# Patient Record
Sex: Male | Born: 1985 | Race: White | Hispanic: No | Marital: Married | Smoking: Never smoker
Health system: Southern US, Community
[De-identification: ages and names within clinical notes are randomized; demographics above are authoritative.]

---

## 2003-07-08 ENCOUNTER — Emergency Department (HOSPITAL_COMMUNITY): Admission: EM | Admit: 2003-07-08 | Discharge: 2003-07-08 | Payer: Self-pay | Admitting: Emergency Medicine

## 2010-04-13 ENCOUNTER — Ambulatory Visit
Admission: RE | Admit: 2010-04-13 | Discharge: 2010-04-13 | Payer: Self-pay | Source: Home / Self Care | Attending: Family Medicine | Admitting: Family Medicine

## 2010-04-13 DIAGNOSIS — J309 Allergic rhinitis, unspecified: Secondary | ICD-10-CM | POA: Insufficient documentation

## 2010-05-12 NOTE — Assessment & Plan Note (Signed)
Summary: NEW TO EST/OK PER CHEMIRA/KN   Vital Signs:  Patient profile:   25 year old male Height:      71 inches Weight:      143 pounds BMI:     20.02 Temp:     98.5 degrees F oral BP sitting:   100 / 70  (left arm)  Vitals Entered By: Doristine Devoid CMA (April 13, 2010 2:17 PM) CC: NEW EST- sinus congestion and cough   History of Present Illness: 25 yo man here today to establish care.  previous MD- none.  cough- sxs started 'a couple of weeks ago'.  cough is mostly dry but occasionally productive.  Tm 100, no recent fever.  no ear pain.  no facial pain/pressure.  mild sore throat.  + sick contacts.  hx of minor seasonal allergies.  Preventive Screening-Counseling & Management  Alcohol-Tobacco     Alcohol drinks/day: <1     Smoking Status: never  Caffeine-Diet-Exercise     Does Patient Exercise: no      Sexual History:  currently monogamous.        Drug Use:  never and marijuana.    Current Medications (verified): 1)  None  Allergies (verified): No Known Drug Allergies  Past History:  Past Medical History: none   Past Surgical History: none  Family History: CAD-no HTN-no DM-no STROKE-no COLON CA-no PROSTATE CA-no  Social History: married 1 dog works as Research scientist (physical sciences) at [employer] Status:  never Does Patient Exercise:  no Drug Use:  never, marijuana Sexual History:  currently monogamous  Review of Systems      See HPI  Physical Exam  General:  Well-developed,well-nourished,in no acute distress; alert,appropriate and cooperative throughout examination Head:  Normocephalic and atraumatic without obvious abnormalities. No apparent alopecia or balding.  no TTP over sinuses Eyes:  no injxn or inflammation Ears:  External ear exam shows no significant lesions or deformities.  Otoscopic examination reveals clear canals, tympanic membranes are intact bilaterally without bulging, retraction,  inflammation or discharge. Hearing is grossly normal bilaterally. Nose:  + congestion and turbinate edema Mouth:  + PND Neck:  No deformities, masses, or tenderness noted. Lungs:  Normal respiratory effort, chest expands symmetrically. Lungs are clear to auscultation, no crackles or wheezes.  + dry cough Heart:  Normal rate and regular rhythm. S1 and S2 normal without gallop, murmur, click, rub or other extra sounds.   Impression & Recommendations:  Problem # 1:  BRONCHITIS- ACUTE (ICD-466.0) Assessment New given duration of illness will start Azithromycin and cough meds as needed.  reviewed supportive care and red flags that should prompt return.  Pt expresses understanding and is in agreement w/ this plan. His updated medication list for this problem includes:    Azithromycin 250 Mg Tabs (Azithromycin) .Marland Kitchen... 2 by  mouth today and then 1 daily for 4 days    Tessalon 200 Mg Caps (Benzonatate) .Marland Kitchen... Take one capsule by mouth three times a day as needed for cough  Problem # 2:  RHINITIS (ICD-477.9) Assessment: New PND likely contributing to pt's cough.  start nasal steroid spray to improve sxs. His updated medication list for this problem includes:    Nasonex 50 Mcg/act Susp (Mometasone furoate) .Marland Kitchen... 2 sprays each nostril once daily  Complete Medication List: 1)  Azithromycin 250 Mg Tabs (Azithromycin) .... 2 by  mouth today and then 1 daily for 4 days 2)  Tessalon 200 Mg Caps (Benzonatate) .... Take one capsule  by mouth three times a day as needed for cough 3)  Nasonex 50 Mcg/act Susp (Mometasone furoate) .... 2 sprays each nostril once daily  Patient Instructions: 1)  This appears to be a bronchitis/post nasal drip combo 2)  Take the Azithromycin for the bronchitis 3)  Use the nasal spray for the post nasal drip 4)  Take the cough pills as needed 5)  Drink plenty of fluids 6)  Call with any questions or concerns 7)  Hang in there! 8)  Welcome!  We're glad to have  you! Prescriptions: NASONEX 50 MCG/ACT SUSP (MOMETASONE FUROATE) 2 sprays each nostril once daily  #1 x 3   Entered and Authorized by:   Neena Rhymes MD   Signed by:   Neena Rhymes MD on 04/13/2010   Method used:   Electronically to        [facility] (retail)       [address]       [place], [place]  [address]       Ph: [phone]       Fax: [phone]   RxID:   [id number] TESSALON 200 MG CAPS (BENZONATATE) Take one capsule by mouth three times a day as needed for cough  #60 x 0   Entered and Authorized by:   Neena Rhymes MD   Signed by:   Neena Rhymes MD on 04/13/2010   Method used:   Electronically to        [facility] (retail)       [address]       [place], [place]  [address]       Ph: [phone]       Fax: [phone]   RxID:   [id number] AZITHROMYCIN 250 MG  TABS (AZITHROMYCIN) 2 by  mouth today and then 1 daily for 4 days  #6 x 0   Entered and Authorized by:   Neena Rhymes MD   Signed by:   Neena Rhymes MD on 04/13/2010   Method used:   Electronically to        [facility] (retail)       [address]       [place], [place]  [address]       Ph: [phone]       Fax: [phone]   RxID:   [id number]    Orders Added: 1)  New Patient Level II [07371]

## 2017-05-12 ENCOUNTER — Emergency Department (HOSPITAL_BASED_OUTPATIENT_CLINIC_OR_DEPARTMENT_OTHER): Payer: 59

## 2017-05-12 ENCOUNTER — Observation Stay (HOSPITAL_BASED_OUTPATIENT_CLINIC_OR_DEPARTMENT_OTHER)
Admission: EM | Admit: 2017-05-12 | Discharge: 2017-05-13 | Disposition: A | Discharge status: Home / Self Care | Payer: 59 | Attending: Surgery | Admitting: Surgery

## 2017-05-12 ENCOUNTER — Other Ambulatory Visit: Payer: Self-pay

## 2017-05-12 ENCOUNTER — Observation Stay (HOSPITAL_COMMUNITY): Payer: 59 | Admitting: Certified Registered"

## 2017-05-12 ENCOUNTER — Encounter (HOSPITAL_BASED_OUTPATIENT_CLINIC_OR_DEPARTMENT_OTHER): Payer: Self-pay | Admitting: *Deleted

## 2017-05-12 ENCOUNTER — Encounter (HOSPITAL_COMMUNITY)
Admission: EM | Disposition: A | Discharge status: Home / Self Care | Payer: Self-pay | Source: Home / Self Care | Attending: Emergency Medicine

## 2017-05-12 DIAGNOSIS — K381 Appendicular concretions: Secondary | ICD-10-CM | POA: Insufficient documentation

## 2017-05-12 DIAGNOSIS — R911 Solitary pulmonary nodule: Secondary | ICD-10-CM | POA: Diagnosis present

## 2017-05-12 DIAGNOSIS — K3580 Unspecified acute appendicitis: Principal | ICD-10-CM | POA: Insufficient documentation

## 2017-05-12 DIAGNOSIS — N400 Enlarged prostate without lower urinary tract symptoms: Secondary | ICD-10-CM | POA: Insufficient documentation

## 2017-05-12 HISTORY — PX: LAPAROSCOPIC APPENDECTOMY: SHX408

## 2017-05-12 LAB — CBC WITH DIFFERENTIAL/PLATELET
Basophils Absolute: 0 10*3/uL (ref 0.0–0.1)
Basophils Relative: 0 %
EOS PCT: 2 %
Eosinophils Absolute: 0.2 10*3/uL (ref 0.0–0.7)
HCT: 41.8 % (ref 39.0–52.0)
HEMOGLOBIN: 14.4 g/dL (ref 13.0–17.0)
LYMPHS ABS: 1.6 10*3/uL (ref 0.7–4.0)
LYMPHS PCT: 18 %
MCH: 30 pg (ref 26.0–34.0)
MCHC: 34.4 g/dL (ref 30.0–36.0)
MCV: 87.1 fL (ref 78.0–100.0)
Monocytes Absolute: 1.1 10*3/uL — ABNORMAL HIGH (ref 0.1–1.0)
Monocytes Relative: 13 %
Neutro Abs: 5.8 10*3/uL (ref 1.7–7.7)
Neutrophils Relative %: 67 %
PLATELETS: 166 10*3/uL (ref 150–400)
RBC: 4.8 MIL/uL (ref 4.22–5.81)
RDW: 13.1 % (ref 11.5–15.5)
WBC: 8.7 10*3/uL (ref 4.0–10.5)

## 2017-05-12 LAB — URINALYSIS, ROUTINE W REFLEX MICROSCOPIC
Bilirubin Urine: NEGATIVE
GLUCOSE, UA: NEGATIVE mg/dL
HGB URINE DIPSTICK: NEGATIVE
Ketones, ur: NEGATIVE mg/dL
Leukocytes, UA: NEGATIVE
Nitrite: NEGATIVE
Protein, ur: NEGATIVE mg/dL
SPECIFIC GRAVITY, URINE: 1.025 (ref 1.005–1.030)
pH: 5.5 (ref 5.0–8.0)

## 2017-05-12 LAB — COMPREHENSIVE METABOLIC PANEL
ALT: 38 U/L (ref 17–63)
AST: 31 U/L (ref 15–41)
Albumin: 4.4 g/dL (ref 3.5–5.0)
Alkaline Phosphatase: 62 U/L (ref 38–126)
Anion gap: 6 (ref 5–15)
BUN: 11 mg/dL (ref 6–20)
CHLORIDE: 104 mmol/L (ref 101–111)
CO2: 26 mmol/L (ref 22–32)
CREATININE: 0.72 mg/dL (ref 0.61–1.24)
Calcium: 9.1 mg/dL (ref 8.9–10.3)
GFR calc non Af Amer: >60 mL/min (ref >60)
Glucose, Bld: 87 mg/dL (ref 65–99)
Potassium: 3.5 mmol/L (ref 3.5–5.1)
Sodium: 136 mmol/L (ref 135–145)
Total Bilirubin: 0.4 mg/dL (ref 0.3–1.2)
Total Protein: 7.5 g/dL (ref 6.5–8.1)

## 2017-05-12 LAB — LIPASE, BLOOD: Lipase: 23 U/L (ref 11–51)

## 2017-05-12 SURGERY — APPENDECTOMY, LAPAROSCOPIC
Anesthesia: General | Site: Abdomen

## 2017-05-12 MED ORDER — FENTANYL CITRATE (PF) 250 MCG/5ML IJ SOLN
INTRAMUSCULAR | Status: DC | PRN
  Administered 2017-05-12 (×4): 50 ug via INTRAVENOUS

## 2017-05-12 MED ORDER — HYDROMORPHONE HCL 1 MG/ML IJ SOLN: 1.0000 mg | INTRAMUSCULAR | Status: DC | PRN

## 2017-05-12 MED ORDER — MIDAZOLAM HCL 2 MG/2ML IJ SOLN
INTRAMUSCULAR | Status: AC
  Filled 2017-05-12: qty 2

## 2017-05-12 MED ORDER — FENTANYL CITRATE (PF) 250 MCG/5ML IJ SOLN
INTRAMUSCULAR | Status: AC
Start: 2017-05-12 — End: 2017-05-12
  Filled 2017-05-12: qty 5

## 2017-05-12 MED ORDER — ONDANSETRON HCL 4 MG/2ML IJ SOLN
INTRAMUSCULAR | Status: AC
  Filled 2017-05-12: qty 2

## 2017-05-12 MED ORDER — MIDAZOLAM HCL 2 MG/2ML IJ SOLN
INTRAMUSCULAR | Status: DC | PRN
  Administered 2017-05-12: 2 mg via INTRAVENOUS

## 2017-05-12 MED ORDER — LACTATED RINGERS IV SOLN
INTRAVENOUS | Status: DC | PRN
  Administered 2017-05-12: 16:00:00 via INTRAVENOUS

## 2017-05-12 MED ORDER — SIMETHICONE 80 MG PO CHEW
40.0000 mg | CHEWABLE_TABLET | Freq: Four times a day (QID) | ORAL | Status: DC | PRN
Start: 2017-05-12 — End: 2017-05-13

## 2017-05-12 MED ORDER — METRONIDAZOLE IN NACL 5-0.79 MG/ML-% IV SOLN
500.0000 mg | Freq: Three times a day (TID) | INTRAVENOUS | Status: DC
  Administered 2017-05-12: 500 mg via INTRAVENOUS
  Filled 2017-05-12: qty 100

## 2017-05-12 MED ORDER — OXYCODONE HCL 5 MG PO TABS
5.0000 mg | ORAL_TABLET | ORAL | Status: DC | PRN
  Administered 2017-05-12 – 2017-05-13 (×3): 10 mg via ORAL
  Filled 2017-05-12 (×3): qty 2

## 2017-05-12 MED ORDER — HYDROMORPHONE HCL 1 MG/ML IJ SOLN
INTRAMUSCULAR | Status: AC
  Filled 2017-05-12: qty 1

## 2017-05-12 MED ORDER — LIDOCAINE 2% (20 MG/ML) 5 ML SYRINGE
INTRAMUSCULAR | Status: DC | PRN
  Administered 2017-05-12: 100 mg via INTRAVENOUS

## 2017-05-12 MED ORDER — METRONIDAZOLE IN NACL 5-0.79 MG/ML-% IV SOLN: 500.0000 mg | Freq: Once | INTRAVENOUS | Status: DC

## 2017-05-12 MED ORDER — PHENYLEPHRINE 40 MCG/ML (10ML) SYRINGE FOR IV PUSH (FOR BLOOD PRESSURE SUPPORT)
PREFILLED_SYRINGE | INTRAVENOUS | Status: AC
  Filled 2017-05-12: qty 10

## 2017-05-12 MED ORDER — HYDRALAZINE HCL 20 MG/ML IJ SOLN: 10.0000 mg | INTRAMUSCULAR | Status: DC | PRN

## 2017-05-12 MED ORDER — PROMETHAZINE HCL 25 MG/ML IJ SOLN: 6.2500 mg | INTRAMUSCULAR | Status: DC | PRN

## 2017-05-12 MED ORDER — ONDANSETRON HCL 4 MG/2ML IJ SOLN
4.0000 mg | Freq: Four times a day (QID) | INTRAMUSCULAR | Status: DC | PRN
Start: 2017-05-12 — End: 2017-05-13

## 2017-05-12 MED ORDER — 0.9 % SODIUM CHLORIDE (POUR BTL) OPTIME
TOPICAL | Status: DC | PRN
  Administered 2017-05-12: 1000 mL

## 2017-05-12 MED ORDER — FENTANYL CITRATE (PF) 250 MCG/5ML IJ SOLN
INTRAMUSCULAR | Status: AC
  Filled 2017-05-12: qty 5

## 2017-05-12 MED ORDER — BUPIVACAINE-EPINEPHRINE (PF) 0.5% -1:200000 IJ SOLN
INTRAMUSCULAR | Status: AC
  Filled 2017-05-12: qty 30

## 2017-05-12 MED ORDER — LIDOCAINE 2% (20 MG/ML) 5 ML SYRINGE
INTRAMUSCULAR | Status: AC
Start: 2017-05-12 — End: 2017-05-12
  Filled 2017-05-12: qty 5

## 2017-05-12 MED ORDER — SUCCINYLCHOLINE CHLORIDE 200 MG/10ML IV SOSY
PREFILLED_SYRINGE | INTRAVENOUS | Status: AC
  Filled 2017-05-12: qty 10

## 2017-05-12 MED ORDER — PROPOFOL 10 MG/ML IV BOLUS
INTRAVENOUS | Status: AC
  Filled 2017-05-12: qty 20

## 2017-05-12 MED ORDER — ONDANSETRON 4 MG PO TBDP: 4.0000 mg | ORAL_TABLET | Freq: Four times a day (QID) | ORAL | Status: DC | PRN

## 2017-05-12 MED ORDER — SUGAMMADEX SODIUM 200 MG/2ML IV SOLN
INTRAVENOUS | Status: AC
  Filled 2017-05-12: qty 2

## 2017-05-12 MED ORDER — DEXTROSE 5 % IV SOLN
2.0000 g | Freq: Once | INTRAVENOUS | Status: AC
  Administered 2017-05-12: 2 g via INTRAVENOUS
  Filled 2017-05-12: qty 2

## 2017-05-12 MED ORDER — ENOXAPARIN SODIUM 40 MG/0.4ML ~~LOC~~ SOLN
40.0000 mg | SUBCUTANEOUS | Status: DC
  Administered 2017-05-12: 40 mg via SUBCUTANEOUS
  Filled 2017-05-12: qty 0.4

## 2017-05-12 MED ORDER — DEXAMETHASONE SODIUM PHOSPHATE 10 MG/ML IJ SOLN
INTRAMUSCULAR | Status: AC
  Filled 2017-05-12: qty 1

## 2017-05-12 MED ORDER — ROCURONIUM BROMIDE 10 MG/ML (PF) SYRINGE
PREFILLED_SYRINGE | INTRAVENOUS | Status: AC
  Filled 2017-05-12: qty 5

## 2017-05-12 MED ORDER — SUGAMMADEX SODIUM 200 MG/2ML IV SOLN
INTRAVENOUS | Status: DC | PRN
  Administered 2017-05-12: 154.2 mg via INTRAVENOUS

## 2017-05-12 MED ORDER — HYDROMORPHONE HCL 1 MG/ML IJ SOLN
0.2500 mg | INTRAMUSCULAR | Status: DC | PRN
  Administered 2017-05-12: 0.5 mg via INTRAVENOUS

## 2017-05-12 MED ORDER — DEXTROSE 5 % IV SOLN: 2.0000 g | INTRAVENOUS | Status: DC

## 2017-05-12 MED ORDER — KETOROLAC TROMETHAMINE 30 MG/ML IJ SOLN
30.0000 mg | Freq: Four times a day (QID) | INTRAMUSCULAR | Status: DC
  Administered 2017-05-12 – 2017-05-13 (×2): 30 mg via INTRAVENOUS
  Filled 2017-05-12 (×2): qty 1

## 2017-05-12 MED ORDER — BUPIVACAINE-EPINEPHRINE 0.5% -1:200000 IJ SOLN
INTRAMUSCULAR | Status: DC | PRN
  Administered 2017-05-12: 4 mL

## 2017-05-12 MED ORDER — KETOROLAC TROMETHAMINE 30 MG/ML IJ SOLN: 30.0000 mg | Freq: Four times a day (QID) | INTRAMUSCULAR | Status: DC | PRN

## 2017-05-12 MED ORDER — ONDANSETRON HCL 4 MG/2ML IJ SOLN
INTRAMUSCULAR | Status: DC | PRN
  Administered 2017-05-12: 4 mg via INTRAVENOUS

## 2017-05-12 MED ORDER — CHLORHEXIDINE GLUCONATE CLOTH 2 % EX PADS: 6.0000 | MEDICATED_PAD | Freq: Once | CUTANEOUS | Status: DC

## 2017-05-12 MED ORDER — DEXTROSE-NACL 5-0.9 % IV SOLN
INTRAVENOUS | Status: DC
  Administered 2017-05-12: 19:00:00 via INTRAVENOUS

## 2017-05-12 MED ORDER — ROCURONIUM BROMIDE 10 MG/ML (PF) SYRINGE
PREFILLED_SYRINGE | INTRAVENOUS | Status: DC | PRN
  Administered 2017-05-12: 50 mg via INTRAVENOUS

## 2017-05-12 MED ORDER — SODIUM CHLORIDE 0.9 % IR SOLN
Status: DC | PRN
  Administered 2017-05-12: 1000 mL

## 2017-05-12 MED ORDER — EPHEDRINE 5 MG/ML INJ
INTRAVENOUS | Status: AC
Start: 2017-05-12 — End: 2017-05-12
  Filled 2017-05-12: qty 10

## 2017-05-12 MED ORDER — MORPHINE SULFATE (PF) 4 MG/ML IV SOLN
4.0000 mg | INTRAVENOUS | Status: DC | PRN
  Administered 2017-05-12: 4 mg via INTRAVENOUS
  Filled 2017-05-12: qty 1

## 2017-05-12 MED ORDER — ONDANSETRON HCL 4 MG/2ML IJ SOLN
4.0000 mg | Freq: Once | INTRAMUSCULAR | Status: AC
  Administered 2017-05-12: 4 mg via INTRAVENOUS
  Filled 2017-05-12: qty 2

## 2017-05-12 MED ORDER — MORPHINE SULFATE (PF) 4 MG/ML IV SOLN
4.0000 mg | Freq: Once | INTRAVENOUS | Status: AC
  Administered 2017-05-12: 4 mg via INTRAVENOUS
  Filled 2017-05-12: qty 1

## 2017-05-12 MED ORDER — IOPAMIDOL (ISOVUE-300) INJECTION 61%
100.0000 mL | Freq: Once | INTRAVENOUS | Status: AC | PRN
  Administered 2017-05-12: 100 mL via INTRAVENOUS

## 2017-05-12 MED ORDER — PROPOFOL 10 MG/ML IV BOLUS
INTRAVENOUS | Status: DC | PRN
  Administered 2017-05-12: 150 mg via INTRAVENOUS

## 2017-05-12 SURGICAL SUPPLY — 42 items
APPLIER CLIP ROT 10 11.4 M/L (STAPLE)
BLADE CLIPPER SURG (BLADE) IMPLANT
CANISTER SUCT 3000ML PPV (MISCELLANEOUS) ×3 IMPLANT
CHLORAPREP W/TINT 26ML (MISCELLANEOUS) ×3 IMPLANT
CLIP APPLIE ROT 10 11.4 M/L (STAPLE) IMPLANT
COVER SURGICAL LIGHT HANDLE (MISCELLANEOUS) ×3 IMPLANT
CUTTER FLEX LINEAR 45M (STAPLE) ×3 IMPLANT
DERMABOND ADHESIVE PROPEN (GAUZE/BANDAGES/DRESSINGS) ×2
DERMABOND ADVANCED (GAUZE/BANDAGES/DRESSINGS) ×2
DERMABOND ADVANCED .7 DNX12 (GAUZE/BANDAGES/DRESSINGS) ×1 IMPLANT
DERMABOND ADVANCED .7 DNX6 (GAUZE/BANDAGES/DRESSINGS) ×1 IMPLANT
DRAPE WARM FLUID 44X44 (DRAPE) ×3 IMPLANT
ELECT REM PT RETURN 9FT ADLT (ELECTROSURGICAL) ×3
ELECTRODE REM PT RTRN 9FT ADLT (ELECTROSURGICAL) ×1 IMPLANT
ENDOLOOP SUT PDS II  0 18 (SUTURE)
ENDOLOOP SUT PDS II 0 18 (SUTURE) IMPLANT
GLOVE BIO SURGEON STRL SZ8 (GLOVE) ×3 IMPLANT
GLOVE BIOGEL PI IND STRL 8 (GLOVE) ×1 IMPLANT
GLOVE BIOGEL PI INDICATOR 8 (GLOVE) ×2
GOWN STRL REUS W/ TWL LRG LVL3 (GOWN DISPOSABLE) ×2 IMPLANT
GOWN STRL REUS W/ TWL XL LVL3 (GOWN DISPOSABLE) ×1 IMPLANT
GOWN STRL REUS W/TWL LRG LVL3 (GOWN DISPOSABLE) ×4
GOWN STRL REUS W/TWL XL LVL3 (GOWN DISPOSABLE) ×2
KIT BASIN OR (CUSTOM PROCEDURE TRAY) ×3 IMPLANT
KIT ROOM TURNOVER OR (KITS) ×3 IMPLANT
NS IRRIG 1000ML POUR BTL (IV SOLUTION) ×3 IMPLANT
PAD ARMBOARD 7.5X6 YLW CONV (MISCELLANEOUS) ×6 IMPLANT
POUCH SPECIMEN RETRIEVAL 10MM (ENDOMECHANICALS) ×3 IMPLANT
RELOAD STAPLE TA45 3.5 REG BLU (ENDOMECHANICALS) ×3 IMPLANT
SCISSORS LAP 5X35 DISP (ENDOMECHANICALS) ×3 IMPLANT
SET IRRIG TUBING LAPAROSCOPIC (IRRIGATION / IRRIGATOR) ×3 IMPLANT
SHEARS HARMONIC ACE PLUS 36CM (ENDOMECHANICALS) ×3 IMPLANT
SPECIMEN JAR SMALL (MISCELLANEOUS) ×3 IMPLANT
SUT MON AB 4-0 PC3 18 (SUTURE) ×3 IMPLANT
TOWEL OR 17X24 6PK STRL BLUE (TOWEL DISPOSABLE) ×3 IMPLANT
TOWEL OR 17X26 10 PK STRL BLUE (TOWEL DISPOSABLE) ×3 IMPLANT
TRAY FOLEY CATH SILVER 16FR (SET/KITS/TRAYS/PACK) ×3 IMPLANT
TRAY LAPAROSCOPIC MC (CUSTOM PROCEDURE TRAY) ×3 IMPLANT
TROCAR XCEL BLADELESS 5X75MML (TROCAR) ×6 IMPLANT
TROCAR XCEL BLUNT TIP 100MML (ENDOMECHANICALS) ×3 IMPLANT
TUBING INSUFFLATION (TUBING) ×3 IMPLANT
WATER STERILE IRR 1000ML POUR (IV SOLUTION) ×3 IMPLANT

## 2017-05-12 NOTE — ED Provider Notes (Signed)
Received patient in transfer, has acute appendicitis with a phlegmon is seen on CT scan, discussed with general surgeon who is aware of the patient, patient will be given pain medication, antibiotics at Dr. Rosezena Sensorornett's request, patient appears stable   Eber HongMiller, Bexley Laubach, MD 05/12/17 1404

## 2017-05-12 NOTE — ED Provider Notes (Signed)
[facility] EMERGENCY DEPARTMENT Provider Note   CSN: [id number] Arrival date & time: 05/12/17  0957     History   Chief Complaint Chief Complaint  Patient presents with  . Abdominal Pain    HPI Terrance Lopez is a 32 y.o. male without significant past medical hx who is sent to the ED from PCP with complaint of abdominal pain since yesterday. Patient states yesterday around noon he developed mild generalized abdominal discomfort described as a cramping sensation. States this persisted throughout the day. He woke up at 5:00 this morning with increased pain that had localized to his RLQ. Describes the pain as sharp. States at present it is a 2/10 in severity, however with palpation/movement it worsens significantly. No other alleviating/aggravating factors. Reports associated decreased appetite. [facility] and sent for appendicitis r/o. Patient denies fever, chills, nausea, vomiting, diarrhea, constipation, or blood in stool. Last PO intake was oatmeal at 7AM   HPI  History reviewed. No pertinent past medical history.  Patient Active Problem List   Diagnosis Date Noted  . RHINITIS 04/13/2010    History reviewed. No pertinent surgical history.     Home Medications    Prior to Admission medications   Not on File    Family History History reviewed. No pertinent family history.  Social History Social History   Tobacco Use  . Smoking status: Never Smoker  Substance Use Topics  . Alcohol use: No    Frequency: Never  . Drug use: No     Allergies   Patient has no known allergies.   Review of Systems Review of Systems  Constitutional: Positive for appetite change (decreased). Negative for chills and fever.  Respiratory: Negative for shortness of breath.   Cardiovascular: Negative for chest pain.  Gastrointestinal: Positive for abdominal pain. Negative for blood in stool, constipation, diarrhea, nausea and vomiting.  Genitourinary:  Negative for discharge, dysuria, flank pain, hematuria, scrotal swelling and testicular pain.  All other systems reviewed and are negative.   Physical Exam Updated Vital Signs BP 129/84   Pulse 78   Temp 98.8 F (37.1 C) (Oral)   Resp 18   Ht 5\' 10"  (1.778 m)   Wt 77.1 kg (170 lb)   SpO2 97%   BMI 24.39 kg/m   Physical Exam  Constitutional: He appears well-developed and well-nourished. No distress.  HENT:  Head: Normocephalic and atraumatic.  Eyes: Conjunctivae are normal. Right eye exhibits no discharge. Left eye exhibits no discharge.  Cardiovascular: Normal rate and regular rhythm.  No murmur heard. Pulmonary/Chest: Breath sounds normal. No respiratory distress. He has no wheezes. He has no rales.  Abdominal: Soft. Normal appearance and bowel sounds are normal. He exhibits no distension. There is tenderness in the right lower quadrant. There is tenderness at McBurney's point. There is no rigidity, no rebound, no guarding and no CVA tenderness.  Negative Rovsings, Psoas, and Obturator.   Neurological: He is alert.  Clear speech.   Skin: Skin is warm and dry. No rash noted.  Psychiatric: He has a normal mood and affect. His behavior is normal.  Nursing note and vitals reviewed.    ED Treatments / Results  Labs Results for orders placed or performed during the hospital encounter of 05/12/17  Comprehensive metabolic panel  Result Value Ref Range   Sodium 136 135 - 145 mmol/L   Potassium 3.5 3.5 - 5.1 mmol/L   Chloride 104 101 - 111 mmol/L   CO2 26 22 -  32 mmol/L   Glucose, Bld 87 65 - 99 mg/dL   BUN 11 6 - 20 mg/dL   Creatinine, Ser 6.96 0.61 - 1.24 mg/dL   Calcium 9.1 8.9 - 29.5 mg/dL   Total Protein 7.5 6.5 - 8.1 g/dL   Albumin 4.4 3.5 - 5.0 g/dL   AST 31 15 - 41 U/L   ALT 38 17 - 63 U/L   Alkaline Phosphatase 62 38 - 126 U/L   Total Bilirubin 0.4 0.3 - 1.2 mg/dL   GFR calc non Af Amer >60 >60 mL/min   GFR calc Af Amer >60 >60 mL/min   Anion gap 6 5 - 15    Lipase, blood  Result Value Ref Range   Lipase 23 11 - 51 U/L  CBC with Diff  Result Value Ref Range   WBC 8.7 4.0 - 10.5 K/uL   RBC 4.80 4.22 - 5.81 MIL/uL   Hemoglobin 14.4 13.0 - 17.0 g/dL   HCT 28.4 13.2 - 44.0 %   MCV 87.1 78.0 - 100.0 fL   MCH 30.0 26.0 - 34.0 pg   MCHC 34.4 30.0 - 36.0 g/dL   RDW 10.2 72.5 - 36.6 %   Platelets 166 150 - 400 K/uL   Neutrophils Relative % 67 %   Neutro Abs 5.8 1.7 - 7.7 K/uL   Lymphocytes Relative 18 %   Lymphs Abs 1.6 0.7 - 4.0 K/uL   Monocytes Relative 13 %   Monocytes Absolute 1.1 (H) 0.1 - 1.0 K/uL   Eosinophils Relative 2 %   Eosinophils Absolute 0.2 0.0 - 0.7 K/uL   Basophils Relative 0 %   Basophils Absolute 0.0 0.0 - 0.1 K/uL  Urinalysis, Routine w reflex microscopic  Result Value Ref Range   Color, Urine YELLOW YELLOW   APPearance CLEAR CLEAR   Specific Gravity, Urine 1.025 1.005 - 1.030   pH 5.5 5.0 - 8.0   Glucose, UA NEGATIVE NEGATIVE mg/dL   Hgb urine dipstick NEGATIVE NEGATIVE   Bilirubin Urine NEGATIVE NEGATIVE   Ketones, ur NEGATIVE NEGATIVE mg/dL   Protein, ur NEGATIVE NEGATIVE mg/dL   Nitrite NEGATIVE NEGATIVE   Leukocytes, UA NEGATIVE NEGATIVE    EKG  EKG Interpretation None       Radiology Ct Abdomen Pelvis W Contrast  Result Date: 05/12/2017 CLINICAL DATA:  Right lower quadrant abdominal pain for the past 5 days. Low-grade fever. EXAM: CT ABDOMEN AND PELVIS WITH CONTRAST TECHNIQUE: Multidetector CT imaging of the abdomen and pelvis was performed using the standard protocol following bolus administration of intravenous contrast. CONTRAST:  ISOVUE-300 IOPAMIDOL (ISOVUE-300) INJECTION 61% COMPARISON:  Left hip MR dated 08/14/2007 and lumbar spine MR dated 08/14/2007. FINDINGS: Lower chest: Oval nodule in the left lower lobe measuring 1.5 x 1.1 cm on image number 2 of series 4. Hepatobiliary: No focal liver abnormality is seen. No gallstones, gallbladder wall thickening, or biliary dilatation. Pancreas:  Unremarkable. No pancreatic ductal dilatation or surrounding inflammatory changes. Spleen: Normal in size without focal abnormality. Adrenals/Urinary Tract: Adrenal glands are unremarkable. Kidneys are normal, without renal calculi, focal lesion, or hydronephrosis. Bladder is unremarkable. Stomach/Bowel: Oval, heterogeneous mass in the inferior aspect of the cecum, measuring 3.2 x 3.2 cm on axial image number 49 of series 2 and 2.9 cm in length on coronal image 28. The appendix extends inferiorly from this mass and is dilated, containing fluid and several partially calcified appendicoliths. Appendix: Location: Upper and mid right pelvis Diameter: 15.1 mm Appendicolith: Multiple Mucosal hyper-enhancement: Yes  Extraluminal gas: No Periappendiceal collection: No No gastric or small bowel abnormalities. Vascular/Lymphatic: No significant vascular findings are present. No enlarged abdominal or pelvic lymph nodes. Reproductive: Mildly to moderately enlarged and heterogeneous prostate gland. Other: No abdominal wall hernia or abnormality. No abdominopelvic ascites. Musculoskeletal: Unremarkable bones. IMPRESSION: 1. Acute appendicitis without abscess. 2. 3.2 x 3.2 x 2.9 cm mass in the inferior aspect of the cecum adjacent to the base of the appendix with the appendix extending inferiorly from this mass. This is most likely a phlegmon associated with the acute appendicitis. A primary colon neoplasm is less likely but also possibility. 3. 1.5 x 1.1 cm left lower lobe nodule. This could be benign or malignant, including the possibility of a metastasis if the mass in the cecum is a neoplasm. Consider one of the following in 3 months for both low-risk and high-risk individuals: (a) repeat chest CT, (b) follow-up PET-CT, or (c) tissue sampling. This recommendation follows the consensus statement: Guidelines for Management of Incidental Pulmonary Nodules Detected on CT Images: From the Fleischner Society 2017; Radiology 2017;  284:228-243. These results were called by telephone at the time of interpretation on 05/12/2017 at 11:40 am to [facility], PA-C , who verbally acknowledged these results. Electronically Signed   By: Beckie Salts M.D.   On: 05/12/2017 11:42    Procedures Procedures (including critical care time)  Medications Ordered in ED Medications  morphine 4 MG/ML injection 4 mg (4 mg Intravenous Given 05/12/17 1202)  iopamidol (ISOVUE-300) 61 % injection 100 mL (100 mLs Intravenous Contrast Given 05/12/17 1116)  ondansetron (ZOFRAN) injection 4 mg (4 mg Intravenous Given 05/12/17 1202)     Initial Impression / Assessment and Plan / ED Course  I have reviewed the triage vital signs and the nursing notes.  Pertinent labs & imaging results that were available during my care of the patient were reviewed by me and considered in my medical decision making (see chart for details).   Patient presents with abdominal pain. He is nontoxic appearing, in no apparent distress, vitals are WNL. Hx and physical concerning for appendicitis, DDX also includes: mesenteric adenitis, diverticulitis, gastroenteritis, bowel perforation. Will evaluate with screening labs and CT abdomen/pelvis. Discussed with patient who would like to hold off on pain medication at this time- instructed if he changes his mind to let me know.  11:40: Consult: Discussed case with radiologist Dr. Azucena Kuba- patient with acute appendicitis without abscess, of note there is a mass to the inferior cecum read by radiologist as most likely phlegmon associated with acute appendicitis, however primary colon neoplasm is also a less likely possiblity, patient also with left lower pulmonary nodule.    Lab work grossly unremarkable, of note there is no leukocytosis, anemia, or significant abnormality with kidney or liver function.   Supervising physician Dr. Fayrene Fearing aware of patient- he has personally evaluated the patient and discussed imaging results with him.  Consult placed to general surgery, morphine and zofran ordered.   12:11: CONSULT: Discussed case with general surgeon Dr. Luisa Hart who will evaluate patient after ED to ED transfer to [facility].   12:15: CONSULT: Supervising physician Dr. Fayrene Fearing discussed case with ED physician Dr. Hyacinth Meeker who accepts transfer.   12:20: RE-EVAL: Patient resting comfortably. Discussed results and plan with patient and his wife, provided opportunity for questions, they confirmed understanding and are in agreement with plan.    Final Clinical Impressions(s) / ED Diagnoses   Final diagnoses:  Acute appendicitis, unspecified acute appendicitis type  Pulmonary nodule  ED Discharge Orders    None       Desmond Lope 05/12/17 1256    Rolland Porter, MD 05/14/17 2206

## 2017-05-12 NOTE — Anesthesia Preprocedure Evaluation (Signed)
Anesthesia Evaluation  Patient identified by MRN, date of birth, ID band Patient awake    Reviewed: Allergy & Precautions, NPO status , Patient's Chart, lab work & pertinent test results  Airway Mallampati: I  TM Distance: >3 FB Neck ROM: Full    Dental no notable dental hx. (+) Teeth Intact   Pulmonary neg pulmonary ROS,    breath sounds clear to auscultation       Cardiovascular negative cardio ROS   Rhythm:Regular Rate:Normal     Neuro/Psych negative neurological ROS  negative psych ROS   GI/Hepatic Neg liver ROS, Acute appendicictis   Endo/Other  negative endocrine ROS  Renal/GU negative Renal ROS  negative genitourinary   Musculoskeletal negative musculoskeletal ROS (+)   Abdominal   Peds negative pediatric ROS (+)  Hematology negative hematology ROS (+)   Anesthesia Other Findings   Reproductive/Obstetrics negative OB ROS                             Anesthesia Physical Anesthesia Plan  ASA: I and emergent  Anesthesia Plan: General   Post-op Pain Management:    Induction: Intravenous  PONV Risk Score and Plan: 3 and Treatment may vary due to age or medical condition, Dexamethasone and Ondansetron  Airway Management Planned: Oral ETT  Additional Equipment:   Intra-op Plan:   Post-operative Plan: Extubation in OR  Informed Consent: I have reviewed the patients History and Physical, chart, labs and discussed the procedure including the risks, benefits and alternatives for the proposed anesthesia with the patient or authorized representative who has indicated his/her understanding and acceptance.   Dental advisory given  Plan Discussed with: CRNA  Anesthesia Plan Comments:         Anesthesia Quick Evaluation

## 2017-05-12 NOTE — Anesthesia Procedure Notes (Signed)
Procedure Name: Intubation Date/Time: 05/12/2017 4:49 PM Performed by: Clearnce Sorrel, CRNA Pre-anesthesia Checklist: Patient identified, Emergency Drugs available, Suction available, Patient being monitored and Timeout performed Patient Re-evaluated:Patient Re-evaluated prior to induction Oxygen Delivery Method: Circle system utilized Preoxygenation: Pre-oxygenation with 100% oxygen Induction Type: IV induction Ventilation: Mask ventilation without difficulty Laryngoscope Size: Mac and 4 Grade View: Grade I Tube type: Oral Tube size: 7.5 mm Number of attempts: 1 Airway Equipment and Method: Stylet Placement Confirmation: ETT inserted through vocal cords under direct vision,  positive ETCO2 and breath sounds checked- equal and bilateral Secured at: 23 cm Tube secured with: Tape Dental Injury: Teeth and Oropharynx as per pre-operative assessment

## 2017-05-12 NOTE — Progress Notes (Signed)
Terrance HeckDanielle patient's wife phone number [phone]

## 2017-05-12 NOTE — ED Triage Notes (Signed)
Pt c/o right lower abd pain x 5 hrs ago , sent here from PMD office for r/o appendicitis

## 2017-05-12 NOTE — H&P (Signed)
Terrance Lopez is an 32 y.o. male.   Chief Complaint: Abdominal pain HPI: Patient transferred from [place] emergency room secondary to 1 day history of lower abdominal pain.  The pain started yesterday was in both lower quadrants of his abdomen.  He describes it crampy loss of appetite without nausea or vomiting.  To morning, the pain was located in his right lower quadrant.  He was tender and CT scan showed acute appendicitis with phlegmon.  He was transferred to [facility] for definitive care.  He is comfortable.  Still complains of right lower quadrant abdominal pain but he feels stable.  History reviewed. No pertinent past medical history.  History reviewed. No pertinent surgical history.  History reviewed. No pertinent family history. Social History:  reports that  has never smoked. He does not have any smokeless tobacco history on file. He reports that he does not drink alcohol or use drugs.  Allergies: No Known Allergies   (Not in a hospital admission)  Results for orders placed or performed during the hospital encounter of 05/12/17 (from the past 48 hour(s))  Comprehensive metabolic panel     Status: None   Collection Time: 05/12/17 10:26 AM  Result Value Ref Range   Sodium 136 135 - 145 mmol/L   Potassium 3.5 3.5 - 5.1 mmol/L   Chloride 104 101 - 111 mmol/L   CO2 26 22 - 32 mmol/L   Glucose, Bld 87 65 - 99 mg/dL   BUN 11 6 - 20 mg/dL   Creatinine, Ser 0.72 0.61 - 1.24 mg/dL   Calcium 9.1 8.9 - 10.3 mg/dL   Total Protein 7.5 6.5 - 8.1 g/dL   Albumin 4.4 3.5 - 5.0 g/dL   AST 31 15 - 41 U/L   ALT 38 17 - 63 U/L   Alkaline Phosphatase 62 38 - 126 U/L   Total Bilirubin 0.4 0.3 - 1.2 mg/dL   GFR calc non Af Amer >60 >60 mL/min   GFR calc Af Amer >60 >60 mL/min    Comment: (NOTE) The eGFR has been calculated using the CKD EPI equation. This calculation has not been validated in all clinical situations. eGFR's persistently <60 mL/min signify possible Chronic  Kidney Disease.    Anion gap 6 5 - 15    Comment: Performed at [facility], [address] [place], [place] [address]  Lipase, blood     Status: None   Collection Time: 05/12/17 10:26 AM  Result Value Ref Range   Lipase 23 11 - 51 U/L    Comment: Performed at [facility], [address] [place], [place] [address]  CBC with Diff     Status: Abnormal   Collection Time: 05/12/17 10:26 AM  Result Value Ref Range   WBC 8.7 4.0 - 10.5 K/uL   RBC 4.80 4.22 - 5.81 MIL/uL   Hemoglobin 14.4 13.0 - 17.0 g/dL   HCT 41.8 39.0 - 52.0 %   MCV 87.1 78.0 - 100.0 fL   MCH 30.0 26.0 - 34.0 pg   MCHC 34.4 30.0 - 36.0 g/dL   RDW 13.1 11.5 - 15.5 %   Platelets 166 150 - 400 K/uL   Neutrophils Relative % 67 %   Neutro Abs 5.8 1.7 - 7.7 K/uL   Lymphocytes Relative 18 %   Lymphs Abs 1.6 0.7 - 4.0 K/uL   Monocytes Relative 13 %   Monocytes Absolute 1.1 (H) 0.1 - 1.0 K/uL   Eosinophils  Relative 2 %   Eosinophils Absolute 0.2 0.0 - 0.7 K/uL   Basophils Relative 0 %   Basophils Absolute 0.0 0.0 - 0.1 K/uL    Comment: Performed at [facility], [address] [place], [place] [address]  Urinalysis, Routine w reflex microscopic     Status: None   Collection Time: 05/12/17 11:34 AM  Result Value Ref Range   Color, Urine YELLOW YELLOW   APPearance CLEAR CLEAR   Specific Gravity, Urine 1.025 1.005 - 1.030   pH 5.5 5.0 - 8.0   Glucose, UA NEGATIVE NEGATIVE mg/dL   Hgb urine dipstick NEGATIVE NEGATIVE   Bilirubin Urine NEGATIVE NEGATIVE   Ketones, ur NEGATIVE NEGATIVE mg/dL   Protein, ur NEGATIVE NEGATIVE mg/dL   Nitrite NEGATIVE NEGATIVE   Leukocytes, UA NEGATIVE NEGATIVE    Comment: Microscopic not done on urines with negative protein, blood, leukocytes, nitrite, or glucose < 500 mg/dL. Performed at [facility], [address] [place], [place] [address]    Ct Abdomen Pelvis W Contrast  Result Date: 05/12/2017 CLINICAL DATA:  Right lower quadrant  abdominal pain for the past 5 days. Low-grade fever. EXAM: CT ABDOMEN AND PELVIS WITH CONTRAST TECHNIQUE: Multidetector CT imaging of the abdomen and pelvis was performed using the standard protocol following bolus administration of intravenous contrast. CONTRAST:  119m ISOVUE-300 IOPAMIDOL (ISOVUE-300) INJECTION 61% COMPARISON:  Left hip MR dated 08/14/2007 and lumbar spine MR dated 08/14/2007. FINDINGS: Lower chest: Oval nodule in the left lower lobe measuring 1.5 x 1.1 cm on image number 2 of series 4. Hepatobiliary: No focal liver abnormality is seen. No gallstones, gallbladder wall thickening, or biliary dilatation. Pancreas: Unremarkable. No pancreatic ductal dilatation or surrounding inflammatory changes. Spleen: Normal in size without focal abnormality. Adrenals/Urinary Tract: Adrenal glands are unremarkable. Kidneys are normal, without renal calculi, focal lesion, or hydronephrosis. Bladder is unremarkable. Stomach/Bowel: Oval, heterogeneous mass in the inferior aspect of the cecum, measuring 3.2 x 3.2 cm on axial image number 49 of series 2 and 2.9 cm in length on coronal image 28. The appendix extends inferiorly from this mass and is dilated, containing fluid and several partially calcified appendicoliths. Appendix: Location: Upper and mid right pelvis Diameter: 15.1 mm Appendicolith: Multiple Mucosal hyper-enhancement: Yes Extraluminal gas: No Periappendiceal collection: No No gastric or small bowel abnormalities. Vascular/Lymphatic: No significant vascular findings are present. No enlarged abdominal or pelvic lymph nodes. Reproductive: Mildly to moderately enlarged and heterogeneous prostate gland. Other: No abdominal wall hernia or abnormality. No abdominopelvic ascites. Musculoskeletal: Unremarkable bones. IMPRESSION: 1. Acute appendicitis without abscess. 2. 3.2 x 3.2 x 2.9 cm mass in the inferior aspect of the cecum adjacent to the base of the appendix with the appendix extending inferiorly from  this mass. This is most likely a phlegmon associated with the acute appendicitis. A primary colon neoplasm is less likely but also possibility. 3. 1.5 x 1.1 cm left lower lobe nodule. This could be benign or malignant, including the possibility of a metastasis if the mass in the cecum is a neoplasm. Consider one of the following in 3 months for both low-risk and high-risk individuals: (a) repeat chest CT, (b) follow-up PET-CT, or (c) tissue sampling. This recommendation follows the consensus statement: Guidelines for Management of Incidental Pulmonary Nodules Detected on CT Images: From the Fleischner Society 2017; Radiology 2017; 284:228-243. These results were called by telephone at the time of interpretation on 05/12/2017 at 11:40 am to [facility] [place] PA-C , who verbally acknowledged  these results. Electronically Signed   By: Claudie Revering M.D.   On: 05/12/2017 11:42    Review of Systems  Constitutional: Positive for malaise/fatigue. Negative for chills and fever.  HENT: Negative for hearing loss.   Eyes: Negative for blurred vision.  Respiratory: Negative for cough.   Cardiovascular: Positive for chest pain.  Gastrointestinal: Positive for abdominal pain and nausea. Negative for vomiting.  Genitourinary: Negative for dysuria.  Musculoskeletal: Negative for myalgias.  Skin: Negative for itching and rash.  Neurological: Negative for dizziness.  Psychiatric/Behavioral: Negative for depression.    Blood pressure 117/90, pulse 80, temperature 98.6 F (37 C), temperature source Oral, resp. rate 20, height 5' 10" (1.778 m), weight 77.1 kg (170 lb), SpO2 96 %. Physical Exam  Constitutional: He is oriented to person, place, and time. He appears well-developed and well-nourished.  HENT:  Head: Normocephalic and atraumatic.  Eyes: EOM are normal. Pupils are equal, round, and reactive to light.  Neck: Normal range of motion. Neck supple.  Cardiovascular: Normal rate and regular rhythm.   Respiratory: Effort normal and breath sounds normal.  GI: Soft. There is tenderness. There is rebound and tenderness at McBurney's point.  Neurological: He is alert and oriented to person, place, and time.  Skin: Skin is warm and dry.  Psychiatric: He has a normal mood and affect. His behavior is normal. Judgment and thought content normal.     Assessment/Plan Acute appendicitis  He does have a questionable phlegmon which could indicate perforation.  There is no large abscess.  I discussed medical treatment versus surgical treatment he and his family today.  The pros and cons of each treatment discussed.  There is a possibility of malignancy as well but clinically and historically this favors acute appendicitis.  After discussion of all his options he was to proceed with laparoscopic appendectomy.The procedure has been discussed with the patient.  Alternative therapies have been discussed with the patient.  Operative risks include bleeding,  Infection,  Organ injury,  Nerve injury,  Blood vessel injury,  DVT,  Pulmonary embolism,  Death,  And possible reoperation.  Medical management risks include worsening of present situation.  The success of the procedure is 50 -90 % at treating patients symptoms.  The patient understands and agrees to proceed.  Turner Daniels, MD 05/12/2017, 2:22 PM

## 2017-05-12 NOTE — Op Note (Signed)
Appendectomy, Lap, Procedure Note  Indications: The patient presented with a history of right-sided abdominal pain. A CT scan revealed findings consistent with acute appendicitis.  He was [place] [facility] for definitive treatment.  He was examined and CT scan reviewed.  Mention was made of a mass at the base of the appendix with appendicitis.  I discussed medical management versus laparoscopic appendectomy.  Risks, benefits and other options discussed.  He was to proceed with laparoscopic appendectomy.The procedure has been discussed with the patient.  Alternative therapies have been discussed with the patient.  Operative risks include bleeding,  Infection,  Organ injury,  Nerve injury,  Blood vessel injury,  DVT,  Pulmonary embolism,  Death,  And possible reoperation.  Medical management risks include worsening of present situation.  The success of the procedure is 50 -90 % at treating patients symptoms.  The patient understands and agrees to proceed.  Pre-operative Diagnosis: Acute appendicitis without mention of peritonitis  Post-operative Diagnosis: Same  Surgeon: Clovis Puhomas A Rusti Arizmendi   Assistants: OR staff  Anesthesia: General endotracheal - Double lumen tube and Local anesthesia 0.25.% bupivacaine, with epinephrine  ASA Class: 2  Procedure Details  The patient was seen again in the Holding Room. The risks, benefits, complications, treatment options, and expected outcomes were discussed with the patient and/or family. The possibilities of reaction to medication, pulmonary aspiration, perforation of viscus, bleeding, recurrent infection, finding a normal appendix, the need for additional procedures, failure to diagnose a condition, and creating a complication requiring transfusion or operation were discussed. There was concurrence with the proposed plan and informed consent was obtained. The site of surgery was properly noted/marked. The patient  was taken to Operating Room, identified as Terrance Lopez and the procedure verified as Appendectomy. A Time Out was held and the above information confirmed.  The patient was placed in the supine position and general anesthesia was induced, along with placement of orogastric tube, Venodyne boots, and a Foley catheter. The abdomen was prepped and draped in a sterile fashion. A one centimeter infraumbilical incision was made and the peritoneal cavity was accessed using the OPEN  technique. The pneumoperitoneum was then established to steady pressure of 12 mmHg. A 12 mm port was placed through the umbilical incision. Additional 5 mm cannulas then placed in the left lower quadrant of the abdomen and rright upper quadrant  under direct vision. A careful evaluation of the entire abdomen was carried out. The patient was placed in Trendelenburg and left lateral decubitus position. The small intestines were retracted in the cephalad and left lateral direction away from the pelvis and right lower quadrant. The patient was found to have an enlarged and inflamed appendix that was extending into the pelvis. There was no evidence of perforation.  The appendix was carefully dissected. A window was made in the mesoappendix at the base of the appendix. A harmonic scalpel was used across the mesoappendix. The appendix was divided at its base using an endo-GIA stapler.  Examination of the cecum revealed this to be normal.  I used graspers to palpate the cecum could not see any evidence of masslike lesion.  Intraluminal evaluation is difficult but there was significant inspissated stool in the right cecum.  No evidence of mesenteric mass.  No evidence of retroperitoneal mass.  Photographs are taken of the abdominal cavity.  The appendiceal stump was sealed  without signs of leakage.  Minimal appendiceal stump was left in place. There  was no evidence of bleeding, leakage, or complication after division of the appendix. Irrigation was  also performed and irrigate suctioned from the abdomen as well.  The umbilical port site was closed using 0 vicryl pursestring sutures fashion at the level of the fascia. The trocar site skin wounds were closed using 4 0 monocryl.  Instrument, sponge, and needle counts were correct at the conclusion of the case.   Findings: The appendix was found to be inflamed. There were not signs of necrosis.  There was not perforation. There was not abscess formation.  The appendix was thickened and elevated to the base.  The cecum was grossly normal.  Palpation of the cecum was normal.  There are no other masses in the right lower quadrant.  Estimated Blood Loss:  less than 50 mL         Drains: none          Total IV Fluids: per anesthesia record         Specimens: appendix          Complications:  None; patient tolerated the procedure well.         Disposition: PACU - hemodynamically stable.         Condition: stable

## 2017-05-12 NOTE — ED Provider Notes (Signed)
Patient seen and examined.  Discussed with PA.  Patient has a classic story for appendicitis with generalized abdominal pain through the day yesterday.  Mild.  Poor appetite but no vomiting.  Localized right lower quadrant this morning.  Referred here by primary care.  CT scan shows dilated appendix with appendicolith.  Will discuss with general surgeon at [facility] regarding transfer for appendectomy.   Rolland PorterJames, Clarann Helvey, MD 05/12/17 1154

## 2017-05-12 NOTE — ED Notes (Signed)
Dr Cornett at bedside. 

## 2017-05-12 NOTE — ED Notes (Signed)
Hooked patient up to the monitor patient is resting with family at bedside and call bell in reach 

## 2017-05-12 NOTE — Transfer of Care (Signed)
Immediate Anesthesia Transfer of Care Note  Patient: Terrance Lopez  Procedure(s) Performed: APPENDECTOMY LAPAROSCOPIC (N/A Abdomen)  Patient Location: PACU  Anesthesia Type:General  Level of Consciousness: awake, alert  and oriented  Airway & Oxygen Therapy: Patient Spontanous Breathing  Post-op Assessment: Report given to RN and Post -op Vital signs reviewed and stable  Post vital signs: Reviewed and stable  Last Vitals:  Vitals:   05/12/17 1430 05/12/17 1500  BP: 109/81 112/77  Pulse: 83 65  Resp:    Temp:    SpO2: 96% 96%    Last Pain:  Vitals:   05/12/17 1513  TempSrc:   PainSc: 3          Complications: No apparent anesthesia complications

## 2017-05-13 LAB — COMPREHENSIVE METABOLIC PANEL
ALBUMIN: 3.5 g/dL (ref 3.5–5.0)
ALK PHOS: 58 U/L (ref 38–126)
ALT: 29 U/L (ref 17–63)
AST: 21 U/L (ref 15–41)
Anion gap: 7 (ref 5–15)
BUN: 8 mg/dL (ref 6–20)
CALCIUM: 8.6 mg/dL — AB (ref 8.9–10.3)
CO2: 26 mmol/L (ref 22–32)
CREATININE: 0.83 mg/dL (ref 0.61–1.24)
Chloride: 106 mmol/L (ref 101–111)
GFR calc Af Amer: >60 mL/min (ref >60)
GFR calc non Af Amer: >60 mL/min (ref >60)
GLUCOSE: 87 mg/dL (ref 65–99)
Potassium: 4 mmol/L (ref 3.5–5.1)
Sodium: 139 mmol/L (ref 135–145)
TOTAL PROTEIN: 6.2 g/dL — AB (ref 6.5–8.1)
Total Bilirubin: 0.4 mg/dL (ref 0.3–1.2)

## 2017-05-13 LAB — CBC
HCT: 40.6 % (ref 39.0–52.0)
Hemoglobin: 13.5 g/dL (ref 13.0–17.0)
MCH: 30.1 pg (ref 26.0–34.0)
MCHC: 33.3 g/dL (ref 30.0–36.0)
MCV: 90.4 fL (ref 78.0–100.0)
Platelets: 157 10*3/uL (ref 150–400)
RBC: 4.49 MIL/uL (ref 4.22–5.81)
RDW: 13.3 % (ref 11.5–15.5)
WBC: 6.7 10*3/uL (ref 4.0–10.5)

## 2017-05-13 LAB — HIV ANTIBODY (ROUTINE TESTING W REFLEX): HIV Screen 4th Generation wRfx: NONREACTIVE

## 2017-05-13 MED ORDER — HYDROCODONE-ACETAMINOPHEN 5-325 MG PO TABS: 1.0000 | ORAL_TABLET | Freq: Four times a day (QID) | ORAL | 0 refills | Status: DC | PRN

## 2017-05-13 NOTE — Discharge Summary (Signed)
  Patient ID: Terrance Lopez 32 y.o. [id number]  Admit date: 05/12/2017  Discharge date and time: 05/13/2017  Admitting Physician: Harriette Bouillonornett, Thomas  Discharge Physician: Ernestene MentionHaywood M Elex Mainwaring  Admission Diagnoses: Pulmonary nodule [R91.1] Acute appendicitis, unspecified acute appendicitis type [K35.80]  Discharge Diagnoses: acute appendicitis  Operations: Procedure(s): APPENDECTOMY LAPAROSCOPIC  Admission Condition: fair  Discharged Condition: good  Indication for Admission: The patient presented with a history of right-sided abdominal pain. A CT scan revealed findings consistent with acute appendicitis.  He was [place] [facility] for definitive [facility].  He was examined and CT scan reviewed.  Mention was made of a mass at the base of the appendix with appendicitis.  I discussed medical management versus laparoscopic appendectomy.  Risks, benefits and other options discussed.  He was to proceed with laparoscopic appendectomy. The patient understands and agrees to proceed.    Hospital Course: the patient was taken to the operating room and underwent laparoscopic appendectomy. He was found to have acute appendicitis but no evidence of rupture peritonitis or abscess.  He was observed overnight and did well.  He resumed independent ambulation, no difficulty voiding, and regular diet.  Examination on postop day 1 revealed he was awake and alert and comfortable.  Abdomen was soft.  Wounds look good.  He was given instruction in diet and activities.  He was given a prescription for Norco for pain.  He was asked to return to see Dr. Luisa Hartornett in 2 weeks.  Consults: None  Significant Diagnostic Studies: lab, imaging studies, surgical pathology  Treatments: surgery: laparoscopic appendectomy  Disposition: Home  Patient Instructions:  Allergies as of 05/13/2017   No Known Allergies     Medication List    TAKE these medications    acetaminophen 500 MG tablet Commonly known as:  TYLENOL Take 500 mg by mouth every 6 (six) hours as needed for mild pain.   HYDROcodone-acetaminophen 5-325 MG tablet Commonly known as:  NORCO Take 1-2 tablets by mouth every 6 (six) hours as needed for moderate pain or severe pain.       Activity: no sports or heavy lifting for 3 weeks.  Return to discharge in one week Diet: regular diet Wound Care: none needed  Follow-up:  With dr. Luisa Hartornett in 2 weeks.  Signed: Angelia MouldHaywood M. Derrell LollingIngram, M.D., FACS General and minimally invasive surgery Breast and Colorectal Surgery  05/13/2017, 9:18 AM

## 2017-05-14 ENCOUNTER — Encounter (HOSPITAL_COMMUNITY): Payer: Self-pay | Admitting: Surgery

## 2017-05-21 NOTE — Anesthesia Postprocedure Evaluation (Signed)
Anesthesia Post Note  Patient: Tinnie Gensyan C Blakeley  Procedure(s) Performed: APPENDECTOMY LAPAROSCOPIC (N/A Abdomen)     Patient location during evaluation: PACU Anesthesia Type: General Level of consciousness: awake and alert Pain management: pain level controlled Vital Signs Assessment: post-procedure vital signs reviewed and stable Respiratory status: spontaneous breathing, nonlabored ventilation, respiratory function stable and patient connected to nasal cannula oxygen Cardiovascular status: blood pressure returned to baseline and stable Postop Assessment: no apparent nausea or vomiting Anesthetic complications: no    Last Vitals:  Vitals:   05/13/17 0024 05/13/17 0411  BP: (!) 90/56 94/61  Pulse: 66 (!) 58  Resp: 15 15  Temp: 36.8 C 36.7 C  SpO2: 99% 97%    Last Pain:  Vitals:   05/13/17 0930  TempSrc:   PainSc: 2                  Xenia Nile,JAMES TERRILL

## 2019-01-28 ENCOUNTER — Emergency Department (HOSPITAL_BASED_OUTPATIENT_CLINIC_OR_DEPARTMENT_OTHER): Payer: 59

## 2019-01-28 ENCOUNTER — Encounter (HOSPITAL_BASED_OUTPATIENT_CLINIC_OR_DEPARTMENT_OTHER): Payer: Self-pay | Admitting: Emergency Medicine

## 2019-01-28 ENCOUNTER — Emergency Department (HOSPITAL_BASED_OUTPATIENT_CLINIC_OR_DEPARTMENT_OTHER)
Admission: EM | Admit: 2019-01-28 | Discharge: 2019-01-28 | Disposition: A | Discharge status: Home / Self Care | Payer: 59 | Attending: Emergency Medicine | Admitting: Emergency Medicine

## 2019-01-28 ENCOUNTER — Other Ambulatory Visit: Payer: Self-pay

## 2019-01-28 DIAGNOSIS — R1032 Left lower quadrant pain: Secondary | ICD-10-CM

## 2019-01-28 DIAGNOSIS — R109 Unspecified abdominal pain: Secondary | ICD-10-CM | POA: Diagnosis present

## 2019-01-28 LAB — COMPREHENSIVE METABOLIC PANEL
ALT: 17 U/L (ref 0–44)
AST: 21 U/L (ref 15–41)
Albumin: 4.9 g/dL (ref 3.5–5.0)
Alkaline Phosphatase: 42 U/L (ref 38–126)
Anion gap: 12 (ref 5–15)
BUN: 8 mg/dL (ref 6–20)
CO2: 25 mmol/L (ref 22–32)
Calcium: 9.5 mg/dL (ref 8.9–10.3)
Chloride: 103 mmol/L (ref 98–111)
Creatinine, Ser: 0.8 mg/dL (ref 0.61–1.24)
GFR calc Af Amer: >60 mL/min (ref >60)
GFR calc non Af Amer: >60 mL/min (ref >60)
Glucose, Bld: 101 mg/dL — ABNORMAL HIGH (ref 70–99)
Potassium: 3.1 mmol/L — ABNORMAL LOW (ref 3.5–5.1)
Sodium: 140 mmol/L (ref 135–145)
Total Bilirubin: 1 mg/dL (ref 0.3–1.2)
Total Protein: 7.9 g/dL (ref 6.5–8.1)

## 2019-01-28 LAB — CBC WITH DIFFERENTIAL/PLATELET
Abs Immature Granulocytes: 0.02 10*3/uL (ref 0.00–0.07)
Basophils Absolute: 0.1 10*3/uL (ref 0.0–0.1)
Basophils Relative: 1 %
Eosinophils Absolute: 0.2 10*3/uL (ref 0.0–0.5)
Eosinophils Relative: 4 %
HCT: 47.9 % (ref 39.0–52.0)
Hemoglobin: 15.9 g/dL (ref 13.0–17.0)
Immature Granulocytes: 0 %
Lymphocytes Relative: 32 %
Lymphs Abs: 1.8 10*3/uL (ref 0.7–4.0)
MCH: 30.2 pg (ref 26.0–34.0)
MCHC: 33.2 g/dL (ref 30.0–36.0)
MCV: 91.1 fL (ref 80.0–100.0)
Monocytes Absolute: 0.4 10*3/uL (ref 0.1–1.0)
Monocytes Relative: 7 %
Neutro Abs: 3.1 10*3/uL (ref 1.7–7.7)
Neutrophils Relative %: 56 %
Platelets: 213 10*3/uL (ref 150–400)
RBC: 5.26 MIL/uL (ref 4.22–5.81)
RDW: 12.2 % (ref 11.5–15.5)
WBC: 5.7 10*3/uL (ref 4.0–10.5)
nRBC: 0 % (ref 0.0–0.2)

## 2019-01-28 LAB — URINALYSIS, ROUTINE W REFLEX MICROSCOPIC
Glucose, UA: NEGATIVE mg/dL
Hgb urine dipstick: NEGATIVE
Ketones, ur: 40 mg/dL — AB
Leukocytes,Ua: NEGATIVE
Nitrite: NEGATIVE
Protein, ur: NEGATIVE mg/dL
Specific Gravity, Urine: 1.02 (ref 1.005–1.030)
pH: 7 (ref 5.0–8.0)

## 2019-01-28 LAB — LACTIC ACID, PLASMA: Lactic Acid, Venous: 1.1 mmol/L (ref 0.5–1.9)

## 2019-01-28 MED ORDER — ONDANSETRON HCL 4 MG/2ML IJ SOLN
4.0000 mg | Freq: Once | INTRAMUSCULAR | Status: AC
  Administered 2019-01-28: 4 mg via INTRAVENOUS
  Filled 2019-01-28: qty 2

## 2019-01-28 MED ORDER — LACTATED RINGERS IV BOLUS
1000.0000 mL | Freq: Once | INTRAVENOUS | Status: AC
  Administered 2019-01-28: 1000 mL via INTRAVENOUS

## 2019-01-28 MED ORDER — IOHEXOL 300 MG/ML  SOLN
100.0000 mL | Freq: Once | INTRAMUSCULAR | Status: AC | PRN
  Administered 2019-01-28: 100 mL via INTRAVENOUS

## 2019-01-28 MED ORDER — DICYCLOMINE HCL 20 MG PO TABS: 20.0000 mg | ORAL_TABLET | Freq: Two times a day (BID) | ORAL | 0 refills | Status: AC

## 2019-01-28 MED ORDER — FENTANYL CITRATE (PF) 100 MCG/2ML IJ SOLN
50.0000 ug | Freq: Once | INTRAMUSCULAR | Status: AC
  Administered 2019-01-28: 50 ug via INTRAVENOUS
  Filled 2019-01-28: qty 2

## 2019-01-28 MED FILL — DICYCLOMINE 20 MG TABLET: 20 | 10 days supply | Qty: 20 | Fill #0

## 2019-01-28 NOTE — ED Provider Notes (Signed)
Emergency Department Provider Note   I have reviewed the triage vital signs and the nursing notes.   HISTORY  Chief Complaint Abdominal Pain   HPI Terrance Lopez is a 33 y.o. male without significant past medical history who presents the emergency department today with left side abdominal pain.  Patient states he has had some nausea mild upper abdominal pain and cramping for the last for 5 days.  He had some postnasal drip and attributed to that initially but he got some spray which improved that he still had symptoms and he got worse over the weekend and specifically got significantly worse overnight last night and really sort of localized towards the left lower quadrant with severe pain.  Some intermittent nausea but no vomiting.  No diarrhea.  He does have some mild constipation but nothing too significant.  Patient states that he was told he had colitis at the time of his appendectomy last year and it would likely need a colonoscopy in the future.  He has had any recent fevers.  No sick contacts.  Tested for Covid on Friday which was negative.   No other associated or modifying symptoms.    History reviewed. No pertinent past medical history.  Patient Active Problem List   Diagnosis Date Noted  . Acute appendicitis 05/12/2017  . RHINITIS 04/13/2010    Past Surgical History:  Procedure Laterality Date  . LAPAROSCOPIC APPENDECTOMY N/A 05/12/2017   Procedure: APPENDECTOMY LAPAROSCOPIC;  Surgeon: Harriette Bouillon, MD;  Location: [facility] OR;  Service: General;  Laterality: N/A;    Current Outpatient Rx  . Order #: [id number] Class: Historical Med    Allergies Patient has no known allergies.  No family history on file.  Social History Social History   Tobacco Use  . Smoking status: Never Smoker  . Smokeless tobacco: Never Used  Substance Use Topics  . Alcohol use: No    Frequency: Never  . Drug use: No    Review of Systems  All other systems negative except as documented  in the HPI. All pertinent positives and negatives as reviewed in the HPI. ____________________________________________   PHYSICAL EXAM:  VITAL SIGNS: ED Triage Vitals  Enc Vitals Group     BP 01/28/19 0556 132/87     Pulse Rate 01/28/19 0556 71     Resp 01/28/19 0556 16     Temp 01/28/19 0556 98.5 F (36.9 C)     Temp Source 01/28/19 0556 Oral     SpO2 01/28/19 0556 99 %     Weight 01/28/19 0555 160 lb (72.6 kg)     Height 01/28/19 0555 5\' 10"  (1.778 m)     Head Circumference --      Peak Flow --      Pain Score 01/28/19 0555 7     Pain Loc --      Pain Edu? --      Excl. in GC? --     Constitutional: Alert and oriented. Well appearing and in no acute distress. Eyes: Conjunctivae are normal. PERRL. EOMI. Head: Atraumatic. Nose: No congestion/rhinnorhea. Mouth/Throat: Mucous membranes are moist.  Oropharynx non-erythematous. Neck: No stridor.  No meningeal signs.   Cardiovascular: Normal rate, regular rhythm. Good peripheral circulation. Grossly normal heart sounds.   Respiratory: Normal respiratory effort.  No retractions. Lungs CTAB. Gastrointestinal: Soft and nontender. No distention.  Musculoskeletal: No lower extremity tenderness nor edema. No gross deformities of extremities. Neurologic:  Normal speech and language. No gross focal neurologic deficits are appreciated.  Skin:  Skin is warm, dry and intact. No rash noted.   ____________________________________________   LABS (all labs ordered are listed, but only abnormal results are displayed)  Labs Reviewed  CBC WITH DIFFERENTIAL/PLATELET  COMPREHENSIVE METABOLIC PANEL  LACTIC ACID, PLASMA  LACTIC ACID, PLASMA  URINALYSIS, ROUTINE W REFLEX MICROSCOPIC   ____________________________________________  RADIOLOGY  No results found.  ____________________________________________   PROCEDURES  Procedure(s) performed:   Procedures   ____________________________________________   INITIAL IMPRESSION /  ASSESSMENT AND PLAN / ED COURSE  Eval for diverticulitis versus colitis.  Pain control, fluids, and antiemetics this time.  CT scan pending kidney function.  Care transferred pending workup and reevaluation.      Pertinent labs & imaging results that were available during my care of the patient were reviewed by me and considered in my medical decision making (see chart for details).  ___________________________________________  FINAL CLINICAL IMPRESSION(S) / ED DIAGNOSES  Final diagnoses:  None     MEDICATIONS GIVEN DURING THIS VISIT:  Medications  lactated ringers bolus 1,000 mL (has no administration in time range)  fentaNYL (SUBLIMAZE) injection 50 mcg (has no administration in time range)  ondansetron (ZOFRAN) injection 4 mg (has no administration in time range)     NEW OUTPATIENT MEDICATIONS STARTED DURING THIS VISIT:  New Prescriptions   No medications on file    Note:  This note was prepared with assistance of Dragon voice recognition software. Occasional wrong-word or sound-a-like substitutions may have occurred due to the inherent limitations of voice recognition software.   Goddess Gebbia, Corene Cornea, MD 01/29/19 [phone]

## 2019-01-28 NOTE — ED Provider Notes (Signed)
  Provider Note MRN:  [id number]  Arrival date & time: 01/28/19    ED Course and Medical Decision Making  Assumed care from Dr. Dayna Barker at shift change.  Left lower quadrant tenderness, CT is pending.  Unless a acute surgical process anticipating discharge possibly with antibiotics.  7:45 AM update: Patient is well-appearing on evaluation, nontender abdomen, CT is without acute process, appropriate for discharge.  Final Clinical Impressions(s) / ED Diagnoses     ICD-10-CM   1. Left lower quadrant abdominal pain  R10.32     ED Discharge Orders         Ordered    dicyclomine (BENTYL) 20 MG tablet  2 times daily     01/28/19 0747            Discharge Instructions     You were evaluated in the Emergency Department and after careful evaluation, we did not find any emergent condition requiring admission or further testing in the hospital.  Your exam/testing today was overall reassuring.  We recommend increased fiber in the diet.  You can also use the medication provided for discomfort.  If symptoms still present for another week, we recommend follow-up with the GI experts.  Please return to the Emergency Department if you experience any worsening of your condition.  We encourage you to follow up with a primary care provider.  Thank you for allowing [place] to be a part of your care.      Barth Kirks. [place]onia Small, [place] [email or web address]    Maudie Flakes, MD 01/28/19 [phone]

## 2019-01-28 NOTE — ED Triage Notes (Signed)
Pt c/o abd pain, denies n/v/d.

## 2019-01-28 NOTE — Discharge Instructions (Addendum)
You were evaluated in the Emergency Department and after careful evaluation, we did not find any emergent condition requiring admission or further testing in the hospital.  Your exam/testing today was overall reassuring.  We recommend increased fiber in the diet.  You can also use the medication provided for discomfort.  If symptoms still present for another week, we recommend follow-up with the GI experts.  Please return to the Emergency Department if you experience any worsening of your condition.  We encourage you to follow up with a primary care provider.  Thank you for allowing [place] to be a part of your care.

## 2019-02-07 ENCOUNTER — Other Ambulatory Visit: Payer: Self-pay

## 2019-02-07 DIAGNOSIS — Z20822 Contact with and (suspected) exposure to covid-19: Secondary | ICD-10-CM

## 2019-02-09 ENCOUNTER — Telehealth: Payer: Self-pay

## 2019-02-09 LAB — NOVEL CORONAVIRUS, NAA: SARS-CoV-2, NAA: NOT DETECTED

## 2019-02-09 NOTE — Telephone Encounter (Signed)
Patient called in requesting [facility] lab results  - DOB/Address - results pending, reviewed testing process with patient, reset MyChart password, no further questions.

## 2019-02-26 ENCOUNTER — Other Ambulatory Visit: Payer: Self-pay

## 2019-02-26 DIAGNOSIS — Z20822 Contact with and (suspected) exposure to covid-19: Secondary | ICD-10-CM

## 2019-02-28 LAB — NOVEL CORONAVIRUS, NAA: SARS-CoV-2, NAA: NOT DETECTED

## 2019-02-28 LAB — SPECIMEN STATUS REPORT

## 2020-09-22 IMAGING — CT CT ABD-PELV W/ CM
2 of 4 series · 16 of 46 positions shown, 18 images · IV contrast (APPLIED)
Comparison: 05/12/2017

CLINICAL DATA: Acute abdominal pain, LEFT lower quadrant pain with
nausea for 6 days, history of appendectomy in 1354

EXAM:
CT ABDOMEN AND PELVIS WITH CONTRAST
TECHNIQUE: Multidetector CT imaging of the abdomen and pelvis was performed
using the standard protocol following bolus administration of
intravenous contrast. Sagittal and coronal MPR images reconstructed
from axial data set.
CONTRAST:  100mL OMNIPAQUE IOHEXOL 300 MG/ML SOLN IV. Dilute oral
contrast.

[Series 2: axial st · axial · 0.78mm/px · z∈[-546,-96]mm · 13 of 100 slices shown, 15 images]
[im 5/100  soft-tissue]
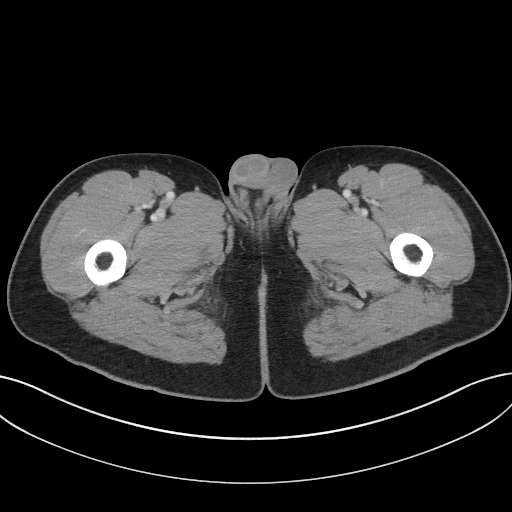
[im 5/100  bone]
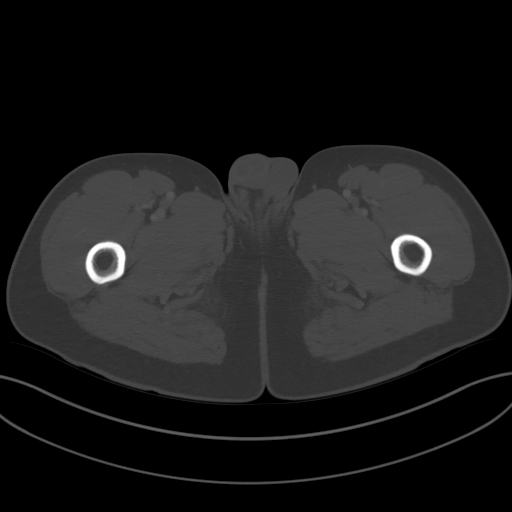
[im 13/100  soft-tissue]
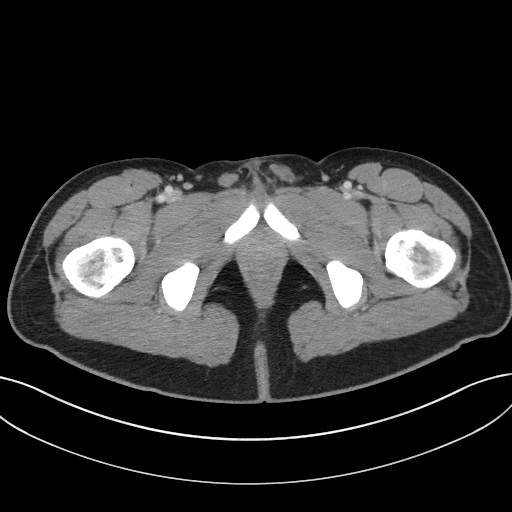
[im 22/100  soft-tissue]
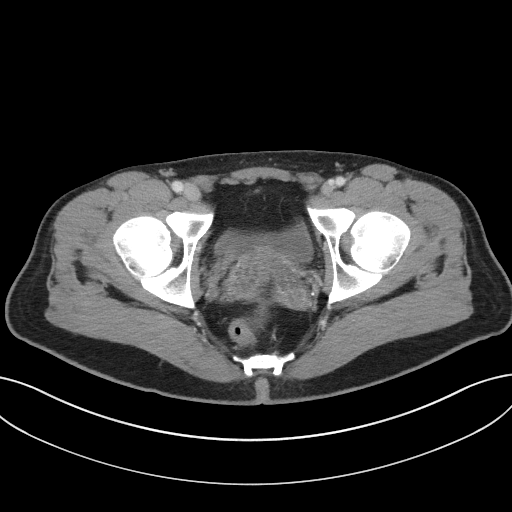
[im 26/100  soft-tissue]
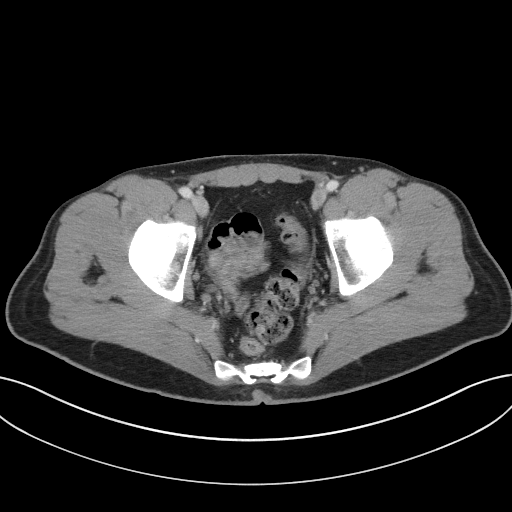
[im 35/100  soft-tissue]
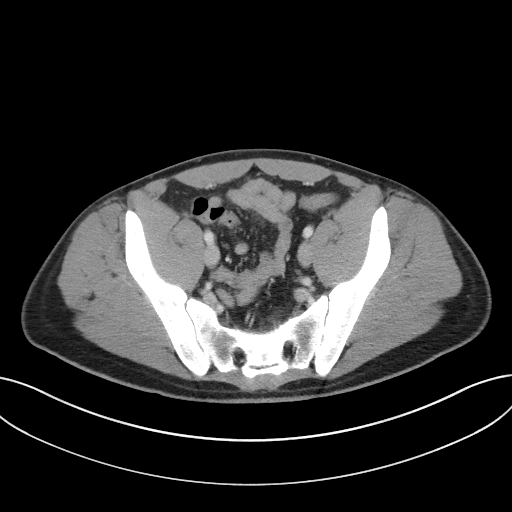
[im 44/100  soft-tissue]
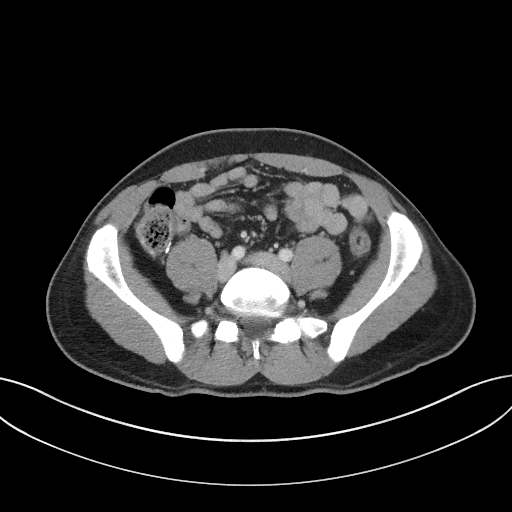
[im 52/100  soft-tissue]
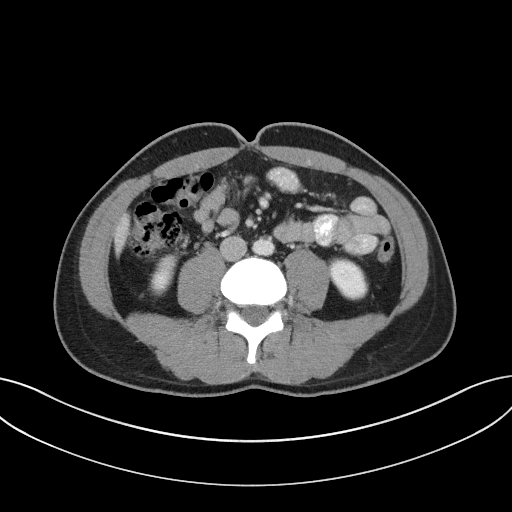
[im 56/100  soft-tissue]
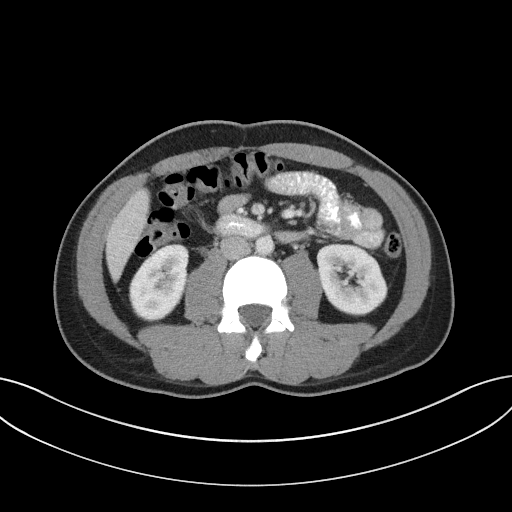
[im 65/100  soft-tissue]
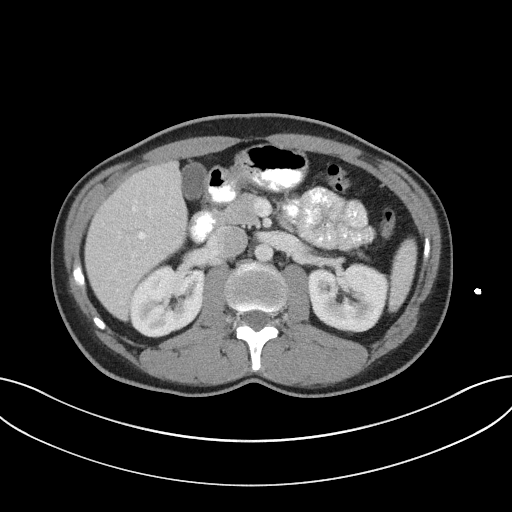
[im 65/100  bone]
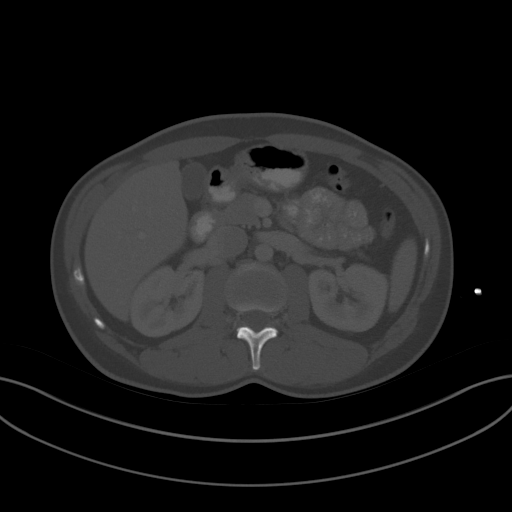
[im 74/100  soft-tissue]
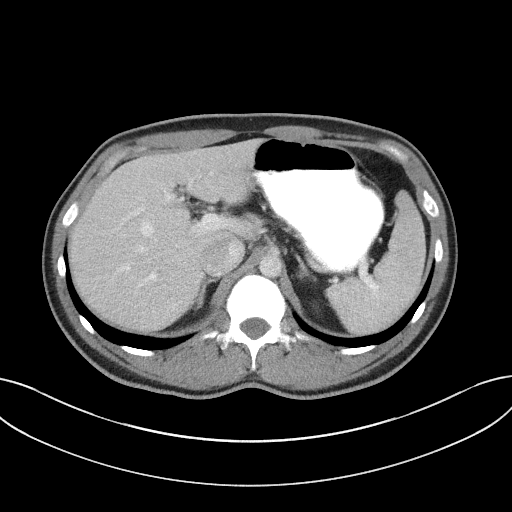
[im 78/100  soft-tissue]
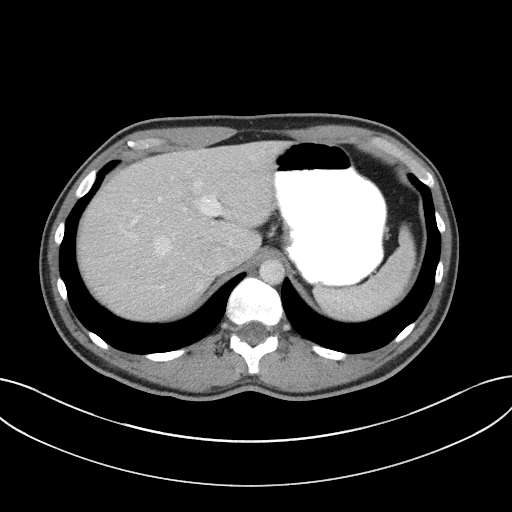
[im 87/100  soft-tissue]
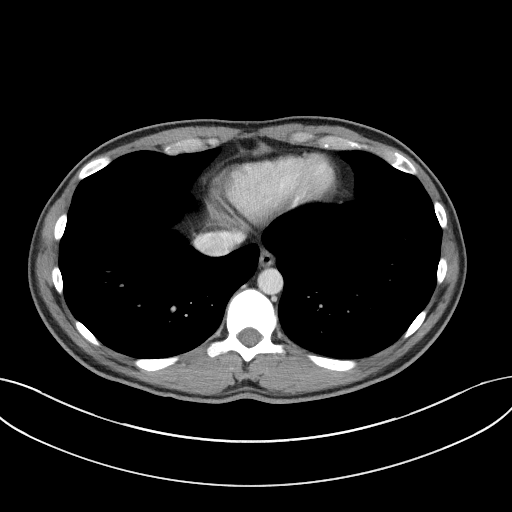
[im 95/100  soft-tissue]
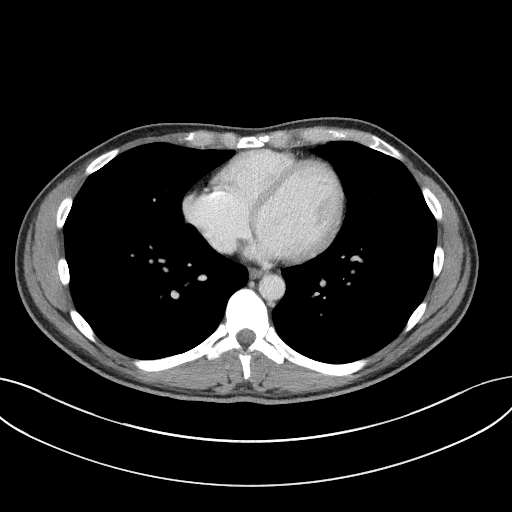

[Series 5: coronal st · coronal · 0.80mm/px · 3 of 71 slices shown]
[im 24/71  soft-tissue]
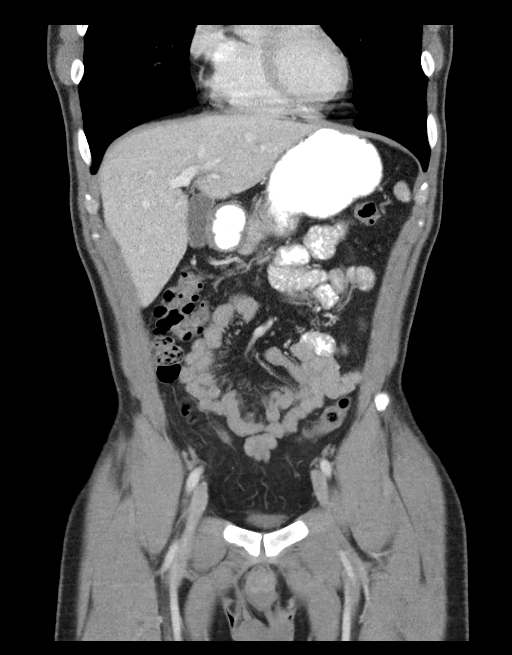
[im 32/71  soft-tissue]
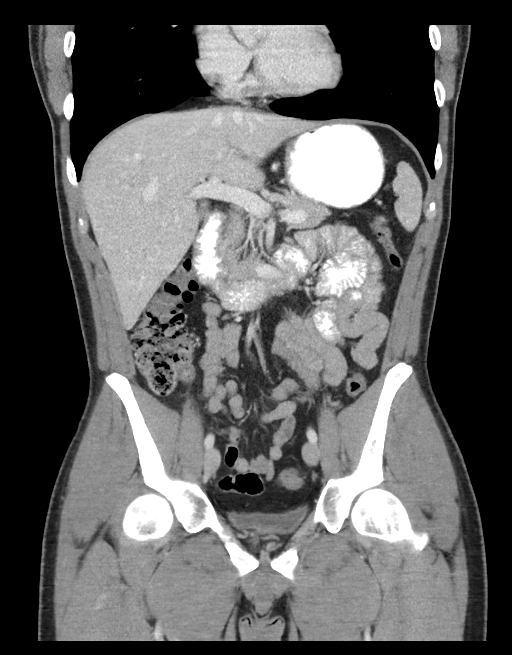
[im 39/71  soft-tissue]
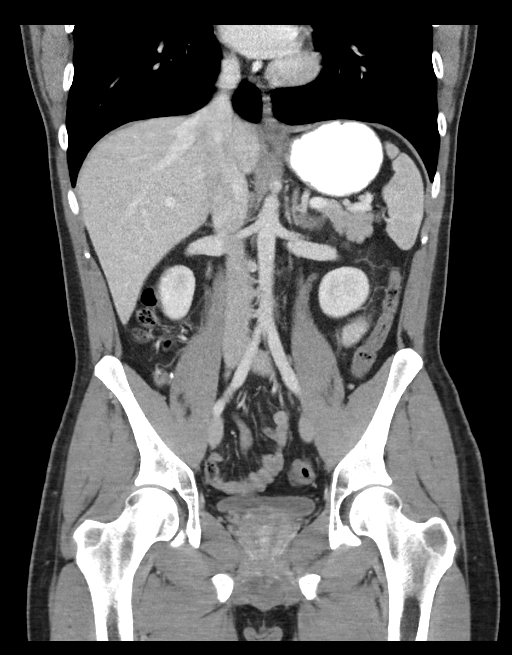

[16 of 46 positions shown; findings below may reference images not displayed]

FINDINGS: Lower chest: Lobulated smoothly marginated noncalcified LEFT lower
lobe nodule, 15 x 11 mm image 12, unchanged. Calcified granuloma
RIGHT lower lobe image 12.

Hepatobiliary: Non-specific 7 x 5 mm low to intermediate attenuation
nodule RIGHT lobe liver image 32, probably unchanged. Gallbladder
and remainder of liver unremarkable.

Pancreas: Normal appearance

Spleen: Normal appearance.  Small splenule noted.

Adrenals/Urinary Tract: Adrenal glands, kidneys, ureters, and
bladder normal appearance

Stomach/Bowel: Appendix surgically absent. Stomach and bowel loops
normal appearance. Cecal wall thickening seen on previous exam
resolved.

Vascular/Lymphatic: Vascular structures patent. No adenopathy.
Pelvic phleboliths present.

Reproductive: Unremarkable prostate gland. Prominent seminal
vesicles unchanged.

Other: No free air or free fluid. No hernia or acute inflammatory
process.

Musculoskeletal: Normal appearance
IMPRESSION: Stable 15 x 11 mm smoothly marginated noncalcified nodule LEFT lower
lobe.

Nonspecific 7 x 5 mm low to intermediate attenuation hepatic lesion,
better visualized than on previous exam but probably unchanged.

No acute intra-abdominal or intrapelvic abnormalities.

## 2021-08-26 ENCOUNTER — Encounter (HOSPITAL_BASED_OUTPATIENT_CLINIC_OR_DEPARTMENT_OTHER): Payer: Self-pay | Admitting: Emergency Medicine

## 2021-08-26 ENCOUNTER — Other Ambulatory Visit: Payer: Self-pay

## 2021-08-26 ENCOUNTER — Emergency Department (HOSPITAL_BASED_OUTPATIENT_CLINIC_OR_DEPARTMENT_OTHER): Payer: 59

## 2021-08-26 ENCOUNTER — Emergency Department (HOSPITAL_BASED_OUTPATIENT_CLINIC_OR_DEPARTMENT_OTHER)
Admission: EM | Admit: 2021-08-26 | Discharge: 2021-08-26 | Disposition: A | Discharge status: Home / Self Care | Payer: 59 | Attending: Emergency Medicine | Admitting: Emergency Medicine

## 2021-08-26 DIAGNOSIS — R079 Chest pain, unspecified: Secondary | ICD-10-CM

## 2021-08-26 DIAGNOSIS — E876 Hypokalemia: Secondary | ICD-10-CM | POA: Insufficient documentation

## 2021-08-26 LAB — BASIC METABOLIC PANEL
Anion gap: 9 (ref 5–15)
BUN: 7 mg/dL (ref 6–20)
CO2: 24 mmol/L (ref 22–32)
Calcium: 9.2 mg/dL (ref 8.9–10.3)
Chloride: 105 mmol/L (ref 98–111)
Creatinine, Ser: 0.88 mg/dL (ref 0.61–1.24)
GFR, Estimated: >60 mL/min (ref >60)
Glucose, Bld: 103 mg/dL — ABNORMAL HIGH (ref 70–99)
Potassium: 3.3 mmol/L — ABNORMAL LOW (ref 3.5–5.1)
Sodium: 138 mmol/L (ref 135–145)

## 2021-08-26 LAB — TROPONIN I (HIGH SENSITIVITY): Troponin I (High Sensitivity): <2 ng/L (ref <18)

## 2021-08-26 LAB — CBC
HCT: 44.3 % (ref 39.0–52.0)
Hemoglobin: 15.1 g/dL (ref 13.0–17.0)
MCH: 30.3 pg (ref 26.0–34.0)
MCHC: 34.1 g/dL (ref 30.0–36.0)
MCV: 89 fL (ref 80.0–100.0)
Platelets: 231 10*3/uL (ref 150–400)
RBC: 4.98 MIL/uL (ref 4.22–5.81)
RDW: 12.4 % (ref 11.5–15.5)
WBC: 8.4 10*3/uL (ref 4.0–10.5)
nRBC: 0 % (ref 0.0–0.2)

## 2021-08-26 MED ORDER — ASPIRIN 325 MG PO TABS
325.0000 mg | ORAL_TABLET | Freq: Every day | ORAL | Status: DC
  Administered 2021-08-26: 325 mg via ORAL
  Filled 2021-08-26: qty 1

## 2021-08-26 NOTE — ED Provider Notes (Signed)
[facility] EMERGENCY DEPARTMENT Provider Note   CSN: [id number] Arrival date & time: 08/26/21  0909     History  Chief Complaint  Patient presents with   Chest Pain    Terrance Lopez is a 36 y.o. male.   Chest Pain  Patient with no contributable medical history presents today due to chest pain.  Started last night, its central in his chest and does not radiate elsewhere.  It is intermittent in nature, unable to identify any provoking features.  Feels like a "achy" pain.  No nausea or vomiting, does not feel short of breath.  Does not smoke cigarettes.  No family history of CAD before the age of 16.  Has not tried anything for the pain.  No recent travel or surgeries, no history of PE or MI  Home Medications Prior to Admission medications   Medication Sig Start Date End Date Taking? Authorizing Provider  dicyclomine (BENTYL) 20 MG tablet Take 1 tablet (20 mg total) by mouth 2 (two) times daily. 01/28/19   Sabas Sous, MD  fluticasone (FLONASE) 50 MCG/ACT nasal spray SPRAY 1 SPRAY INTO EACH NOSTRIL TWICE DAILY 01/23/19   [provider]      Allergies    Patient has no known allergies.    Review of Systems   Review of Systems  Cardiovascular:  Positive for chest pain.   Physical Exam Updated Vital Signs BP 117/78   Pulse 81   Temp 98.4 F (36.9 C) (Oral)   Resp 16   Ht 5\' 10"  (1.778 m)   Wt 74.8 kg   SpO2 99%   BMI 23.68 kg/m  Physical Exam Vitals and nursing note reviewed. Exam conducted with a chaperone present.  Constitutional:      Appearance: Normal appearance.  HENT:     Head: Normocephalic and atraumatic.  Eyes:     General: No scleral icterus.       Right eye: No discharge.        Left eye: No discharge.     Extraocular Movements: Extraocular movements intact.     Pupils: Pupils are equal, round, and reactive to light.  Cardiovascular:     Rate and Rhythm: Normal rate and regular rhythm.     Pulses: Normal pulses.     Heart  sounds: Normal heart sounds. No murmur heard.   No friction rub. No gallop.  Pulmonary:     Effort: Pulmonary effort is normal. No respiratory distress.     Breath sounds: Normal breath sounds.  Abdominal:     General: Abdomen is flat. Bowel sounds are normal. There is no distension.     Palpations: Abdomen is soft.     Tenderness: There is no abdominal tenderness.  Skin:    General: Skin is warm and dry.     Coloration: Skin is not jaundiced.  Neurological:     Mental Status: He is alert. Mental status is at baseline.     Coordination: Coordination normal.    ED Results / Procedures / Treatments   Labs (all labs ordered are listed, but only abnormal results are displayed) Labs Reviewed  BASIC METABOLIC PANEL - Abnormal; Notable for the following components:      Result Value   Potassium 3.3 (*)    Glucose, Bld 103 (*)    All other components within normal limits  CBC  TROPONIN I (HIGH SENSITIVITY)    EKG EKG Interpretation  Date/Time:  Friday Aug 26 2021 09:16:17 EDT Ventricular  Rate:  93 PR Interval:  148 QRS Duration: 87 QT Interval:  373 QTC Calculation: 464 R Axis:   54 Text Interpretation: Sinus rhythm Baseline wander No old tracing to compare Confirmed by Melene Plan [phone]) on 08/26/2021 9:54:50 AM  Radiology DG Chest 2 View  Result Date: 08/26/2021 CLINICAL DATA:  Chest pain for 2 days EXAM: CHEST - 2 VIEW COMPARISON:  None Available. FINDINGS: The heart size and mediastinal contours are within normal limits. Both lungs are clear. The visualized skeletal structures are unremarkable. IMPRESSION: No active cardiopulmonary disease. Electronically Signed   By: Elige Ko M.D.   On: 08/26/2021 09:55    Procedures Procedures    Medications Ordered in ED Medications  aspirin tablet 325 mg (325 mg Oral Given 08/26/21 4193)    ED Course/ Medical Decision Making/ A&P                           Medical Decision Making Amount and/or Complexity of Data  Reviewed Labs: ordered. Radiology: ordered.  Risk OTC drugs.   This patient presents to the ED for concern of chest pain, this involves an extensive number of treatment options, and is a complaint that carries with it a high risk of complications and morbidity.  The differential diagnosis includes PE, ACS, pneumonia, pneumothorax, anxiety, GERD, aortic dissection, esophageal rupture.  Additional history obtained:   External records reviewed, no documented comorbidities noted.    Lab Tests:  I ordered, viewed, and personally interpreted labs.  The pertinent results include: No leukocytosis, slight hypokalemia at 3.3.  Negative troponin.  No anemia.    Imaging Studies ordered:  I directly visualized the chest xray, which showed normal cardiac silhouette.  No acute process.  I agree with the radiologist interpretation    ECG/Cardiac monitoring:   Per my interpretation, EKG shows sinus rhythm, no ischemic ST changes  The patient was maintained on a cardiac monitor.  Visualized monitor strip which showed sinus rhythm per my interpretation.    Medicines ordered and prescription drug management:  I ordered medication including: asa    I have reviewed the patients home medicines and have made adjustments as needed   Test Considered:  Considered PE but patient is PERC negative.  Considered ACS but patient's heart score is actually 0, no ischemic findings on EKG and negative troponin.  Do not feel second troponin indicated.  Reevaluation:  After the interventions noted above, I reevaluated the patient and found resting comfortably, no recurrent pain   Problems addressed / ED Course: Chest pain-doubt ACS, PE, esophageal rupture, dissection.  Patient is under increased rest that could be contributing, also encourage patient to reduce the amount of anti-inflammatory medicine he is taking as this could be causing some gastritis.  Patient verbalized understanding.  Discharged in  stable condition.   Social Determinants of Health: Does not have a PCP   Disposition:   After consideration of the diagnostic results and the patients response to treatment, I feel that the patent would benefit from discharge.            Final Clinical Impression(s) / ED Diagnoses Final diagnoses:  None    Rx / DC Orders ED Discharge Orders     None         Theron Arista, PA-C 08/26/21 1017    Melene Plan, DO 08/26/21 1037

## 2021-08-26 NOTE — Discharge Instructions (Signed)
I do see med of anti-inflammatory medicine you are taking.  Take Tylenol instead when possible.

## 2021-08-26 NOTE — ED Triage Notes (Signed)
States has been having chest pain that comes and goes. ED PA at bedside

## 2023-04-21 IMAGING — CR DG CHEST 2V
2 series · 2 of 2 positions shown · non-contrast
Comparison: None Available.

CLINICAL DATA: Chest pain for 2 days

EXAM:
CHEST - 2 VIEW

[w chest pa]
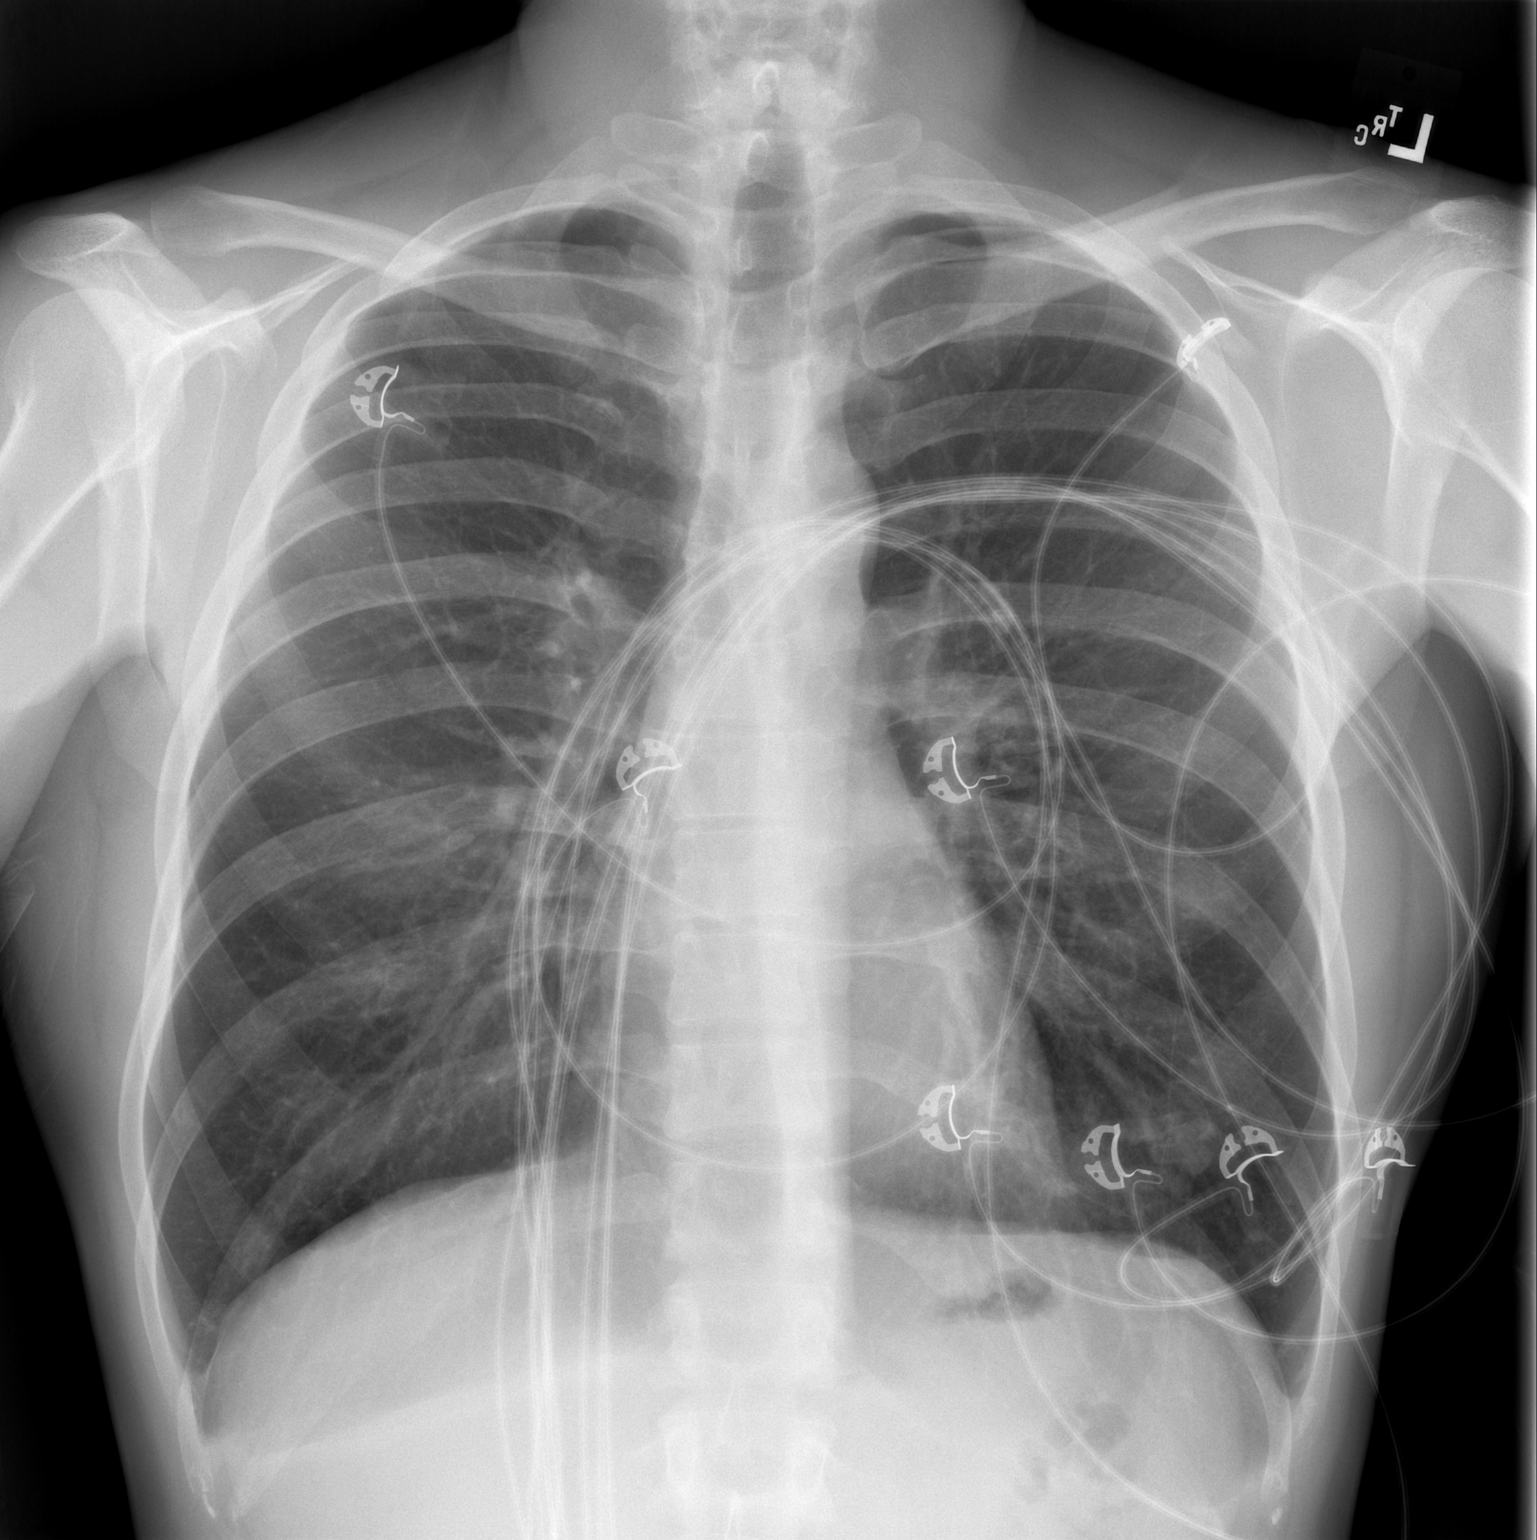

[w chest lat]
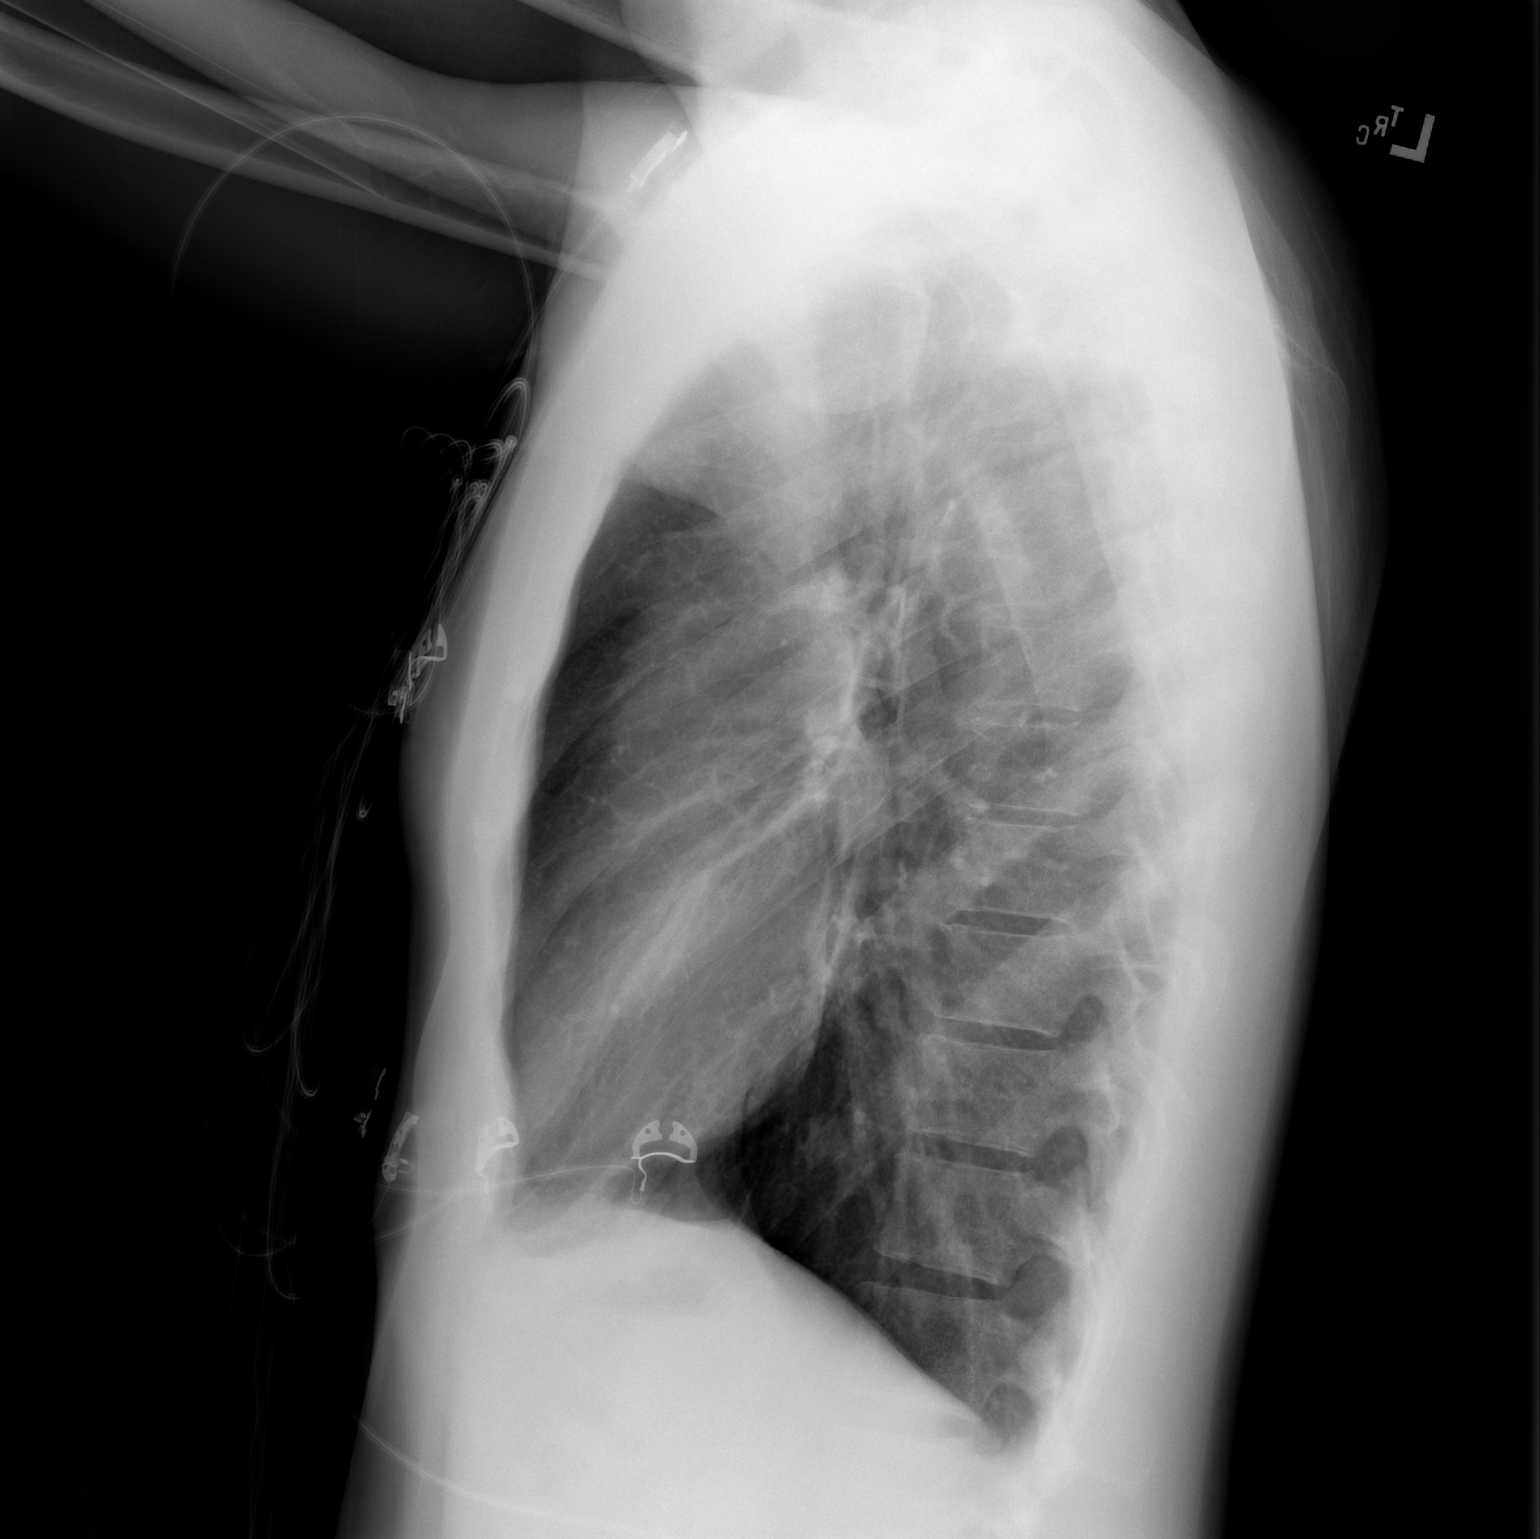

[2 of 2 positions shown; findings below may reference images not displayed]

FINDINGS: The heart size and mediastinal contours are within normal limits.
Both lungs are clear. The visualized skeletal structures are
unremarkable.
IMPRESSION: No active cardiopulmonary disease.
